# Patient Record
Sex: Female | Born: 1985 | Race: White | Hispanic: No | Marital: Single | Smoking: Never smoker
Health system: Southern US, Community
[De-identification: ages and names within clinical notes are randomized; demographics above are authoritative.]

## PROBLEM LIST (undated history)

## (undated) DIAGNOSIS — R002 Palpitations: Secondary | ICD-10-CM

## (undated) DIAGNOSIS — E041 Nontoxic single thyroid nodule: Secondary | ICD-10-CM

## (undated) HISTORY — DX: Palpitations: R00.2

## (undated) HISTORY — PX: URETHRAL DILATION: SUR417

## (undated) HISTORY — DX: Nontoxic single thyroid nodule: E04.1

---

## 2007-02-25 ENCOUNTER — Emergency Department (HOSPITAL_COMMUNITY): Admission: EM | Admit: 2007-02-25 | Discharge: 2007-02-25 | Payer: Self-pay | Admitting: Emergency Medicine

## 2010-10-20 DIAGNOSIS — E041 Nontoxic single thyroid nodule: Secondary | ICD-10-CM

## 2010-10-20 HISTORY — DX: Nontoxic single thyroid nodule: E04.1

## 2010-10-24 ENCOUNTER — Other Ambulatory Visit: Payer: Self-pay | Admitting: Occupational Therapy

## 2010-10-24 ENCOUNTER — Other Ambulatory Visit: Payer: Self-pay | Admitting: Obstetrics & Gynecology

## 2010-10-24 DIAGNOSIS — E049 Nontoxic goiter, unspecified: Secondary | ICD-10-CM

## 2010-10-27 ENCOUNTER — Other Ambulatory Visit: Payer: Self-pay

## 2010-10-30 ENCOUNTER — Ambulatory Visit
Admission: RE | Admit: 2010-10-30 | Discharge: 2010-10-30 | Disposition: A | Discharge status: Home / Self Care | Payer: BC Managed Care – PPO | Source: Ambulatory Visit | Attending: Obstetrics & Gynecology | Admitting: Obstetrics & Gynecology

## 2010-10-30 DIAGNOSIS — E049 Nontoxic goiter, unspecified: Secondary | ICD-10-CM

## 2012-09-25 IMAGING — US US SOFT TISSUE HEAD/NECK
1 series · 14 of 25 positions shown · non-contrast
Comparison: None.

CLINICAL DATA: Enlarged thyroid gland on physical examination.

THYROID ULTRASOUND
TECHNIQUE: Ultrasound examination of the thyroid gland and adjacent
soft tissues was performed.

[Series 1: us soft tissue head/neck · 0.06mm/px · 14 of 35 slices shown]
[im 1/35]
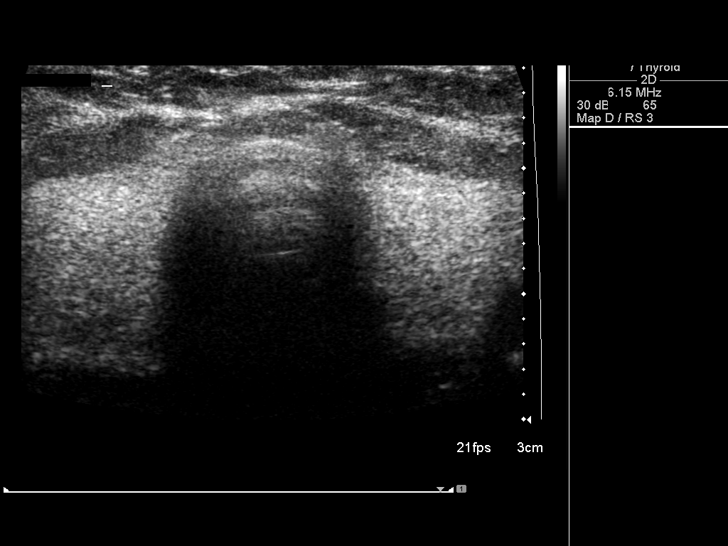
[im 3/35]
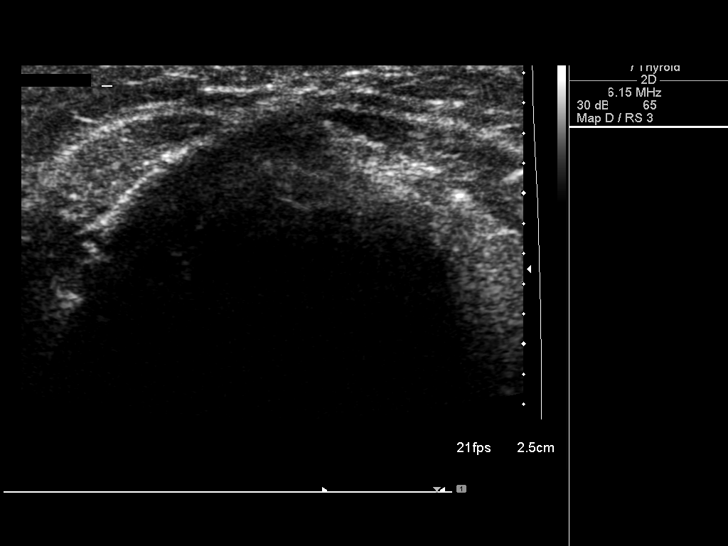
[im 6/35]
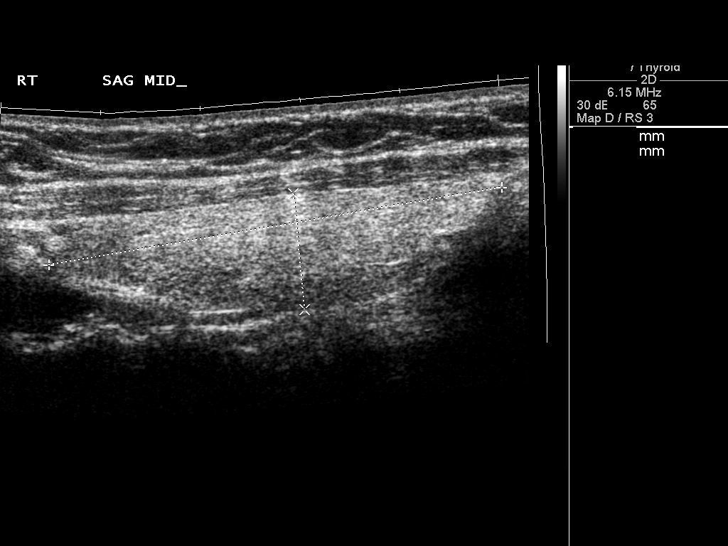
[im 9/35]
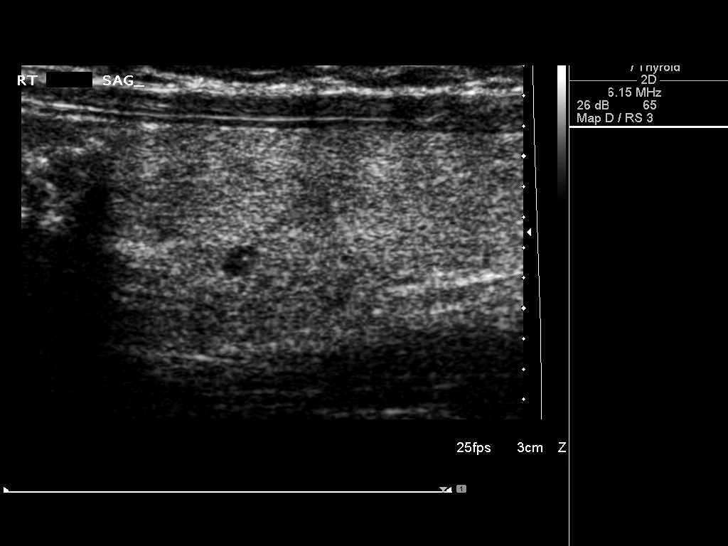
[im 12/35]
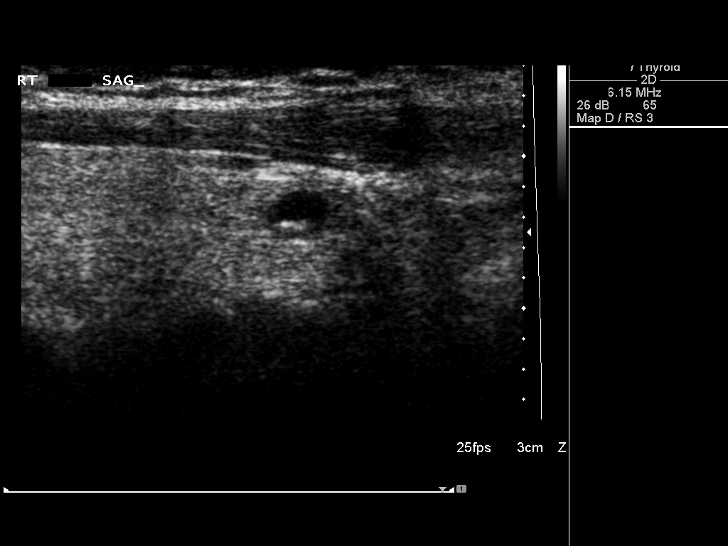
[im 13/35]
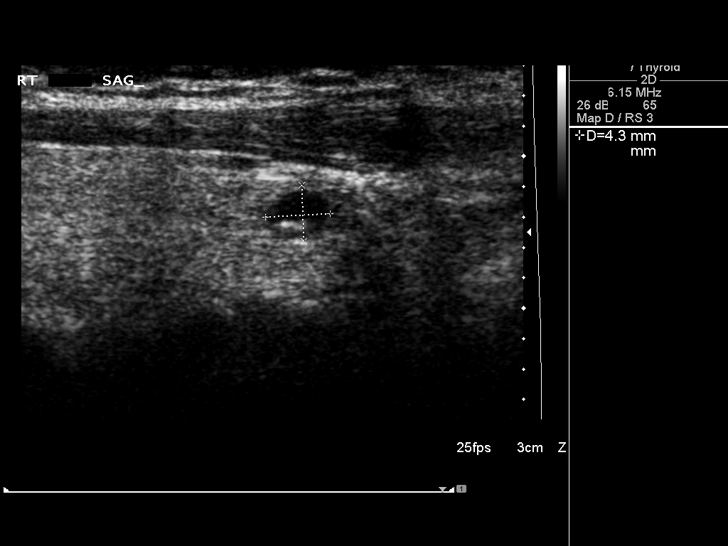
[im 16/35]
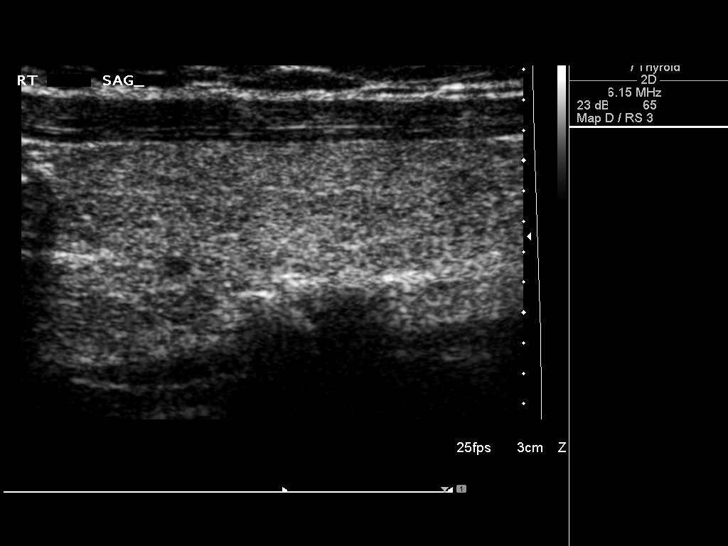
[im 19/35]
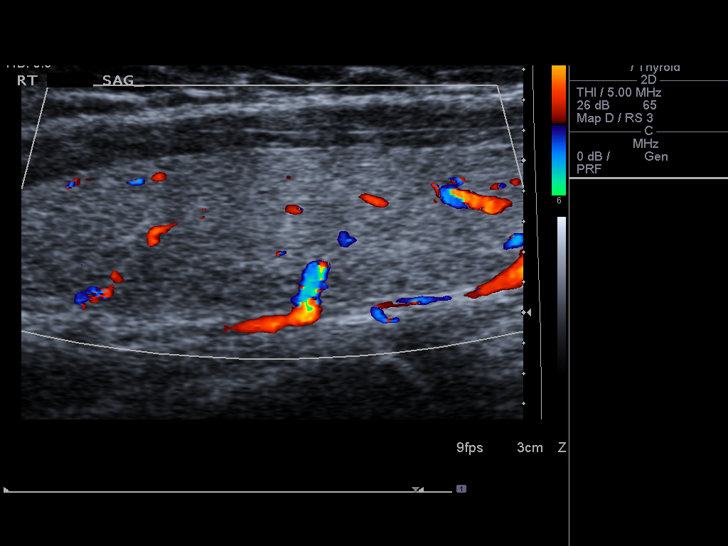
[im 22/35]
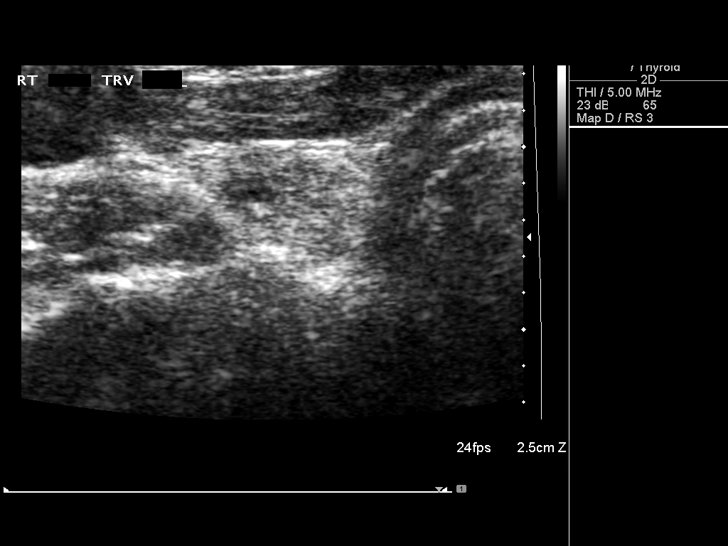
[im 23/35]
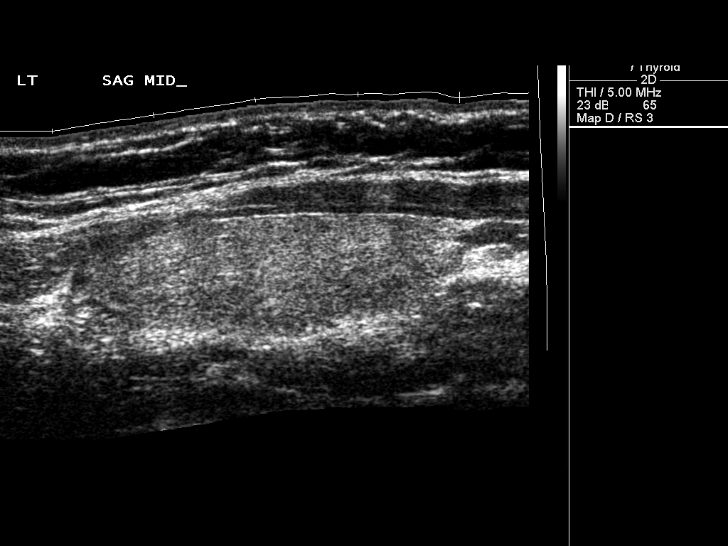
[im 26/35]
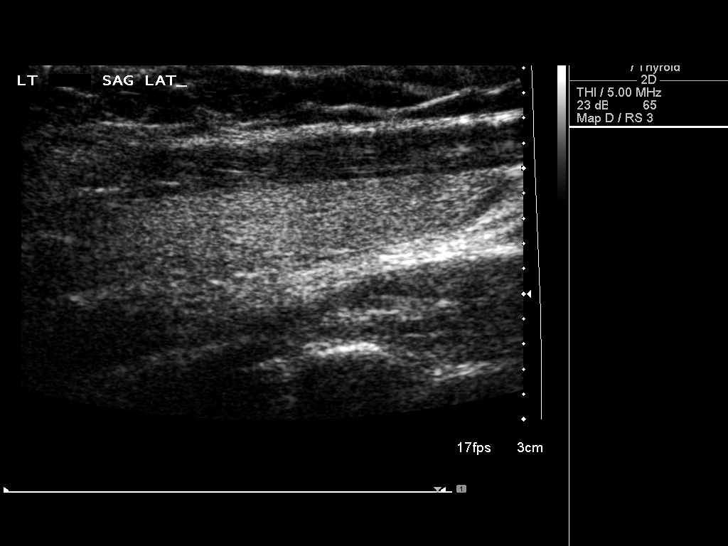
[im 29/35]
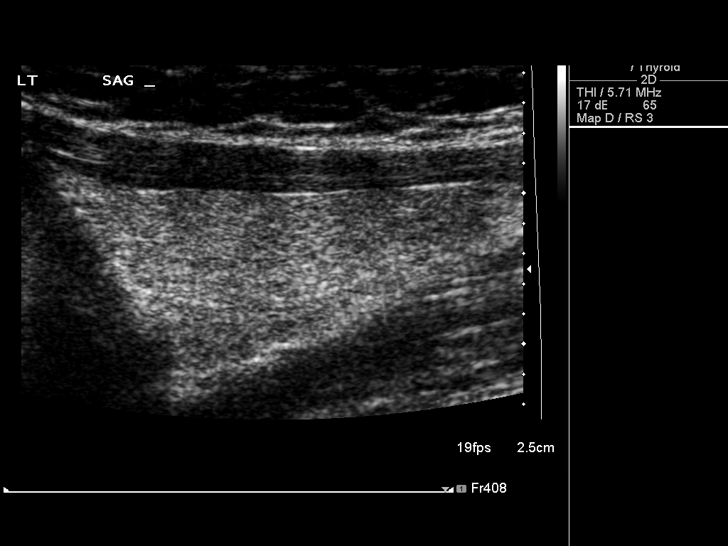
[im 32/35]
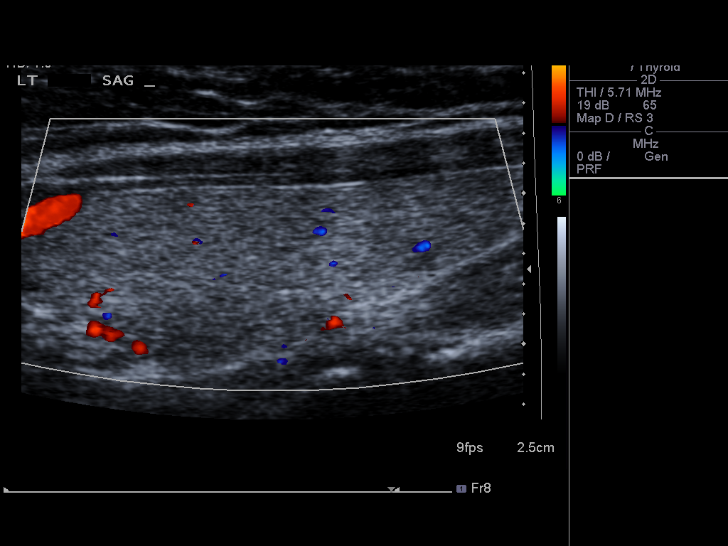
[im 35/35]
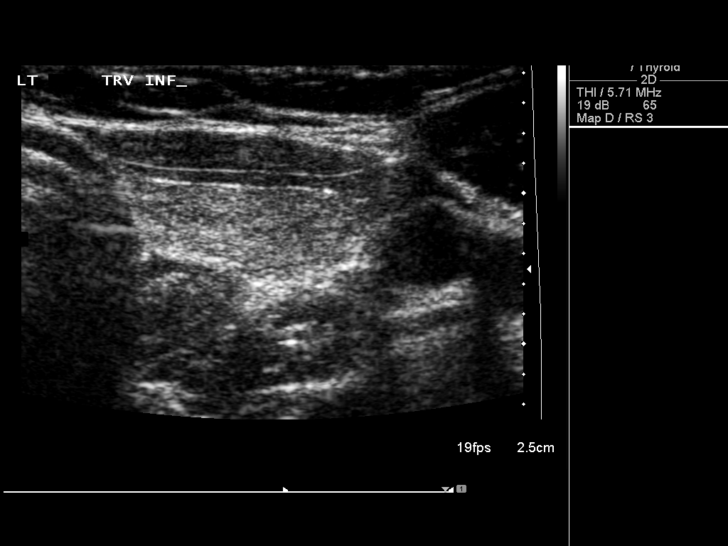

[14 of 25 positions shown; findings below may reference images not displayed]

FINDINGS: Right thyroid lobe:  4.6 x 1.2 x 1.8 cm
Left thyroid lobe:  3.9 x 1.3 x 1.6 cm
Isthmus:  0.3 cm

Focal nodules:  Thyroid echotexture is homogeneous.  Tiny cystic
nodule in the midportion of the right lobe measures 0.3 cm and has
a benign appearance.  Small predominately cystic nodule in the
inferior right lobe measures 0.4 cm and has a benign appearance as
well.

Lymphadenopathy:  None visualized.
IMPRESSION: Benign appearance of thyroid gland by ultrasound which is not
grossly enlarged by measurements.

## 2012-11-05 ENCOUNTER — Encounter: Payer: Self-pay | Admitting: Certified Nurse Midwife

## 2012-11-10 ENCOUNTER — Encounter: Payer: Self-pay | Admitting: Certified Nurse Midwife

## 2012-11-10 ENCOUNTER — Ambulatory Visit (INDEPENDENT_AMBULATORY_CARE_PROVIDER_SITE_OTHER): Payer: BC Managed Care – PPO | Admitting: Certified Nurse Midwife

## 2012-11-10 VITALS — BP 98/62 | HR 60 | Resp 16 | Ht 66.25 in | Wt 175.0 lb

## 2012-11-10 DIAGNOSIS — E049 Nontoxic goiter, unspecified: Secondary | ICD-10-CM

## 2012-11-10 DIAGNOSIS — N760 Acute vaginitis: Secondary | ICD-10-CM

## 2012-11-10 DIAGNOSIS — B373 Candidiasis of vulva and vagina: Secondary | ICD-10-CM

## 2012-11-10 DIAGNOSIS — Z0000 Encounter for general adult medical examination without abnormal findings: Secondary | ICD-10-CM

## 2012-11-10 DIAGNOSIS — E041 Nontoxic single thyroid nodule: Secondary | ICD-10-CM

## 2012-11-10 DIAGNOSIS — Z01419 Encounter for gynecological examination (general) (routine) without abnormal findings: Secondary | ICD-10-CM

## 2012-11-10 LAB — POCT URINALYSIS DIPSTICK
Bilirubin, UA: NEGATIVE
Blood, UA: NEGATIVE
Glucose, UA: NEGATIVE
Leukocytes, UA: NEGATIVE
Nitrite, UA: NEGATIVE
Urobilinogen, UA: NEGATIVE
pH, UA: 5

## 2012-11-10 MED ORDER — FLUCONAZOLE 150 MG PO TABS: 150.0000 mg | ORAL_TABLET | Freq: Once | ORAL | Status: DC

## 2012-11-10 MED ORDER — METRONIDAZOLE 0.75 % VA GEL: 1.0000 | Freq: Two times a day (BID) | VAGINAL | Status: DC

## 2012-11-10 MED ORDER — METRONIDAZOLE 0.75 % VA GEL: 1.0000 | Freq: Every day | VAGINAL | Status: DC

## 2012-11-10 MED ORDER — NORETHIN ACE-ETH ESTRAD-FE 1-20 MG-MCG PO TABS: 1.0000 | ORAL_TABLET | Freq: Every day | ORAL | Status: DC

## 2012-11-10 NOTE — Progress Notes (Signed)
27 y.o. G0P0000 Single Caucasian Fe here for annual exam. Periods normal, no issues. Contraception working well, desires continuance. No STD screening needed, no partner change. Has noticed increase in vaginal discharge over the past few weeks with slight odor.  No new personal products or thong use.  Has started exercising more and wears work out clothes longer.  No other health issues today.  Patient's last menstrual period was 10/19/2012.          Sexually active: yes  The current method of family planning is OCP (estrogen/progesterone).    Exercising: yes  aerobics & spinning Smoker:  no  Health Maintenance: Pap:  10-24-10 neg MMG:  none Colonoscopy:  none BMD:   none TDaP:  2010 Labs: Poct urine-neg, Hgb-13.4 Self breast exam: not done   reports that she has never smoked. She does not have any smokeless tobacco history on file. She reports that she does not drink alcohol or use illicit drugs.  Past Medical History  Diagnosis Date  . Thyroid nodule 6/12    benign    Past Surgical History  Procedure Laterality Date  . Urethral dilation      age 88 & 57    Current Outpatient Prescriptions  Medication Sig Dispense Refill  . norethindrone-ethinyl estradiol (LOESTRIN FE 1/20) 1-20 MG-MCG tablet Take 1 tablet by mouth daily.       No current facility-administered medications for this visit.    Family History  Problem Relation Age of Onset  . Hypertension Mother   . Thyroid disease Father   . Heart failure Father   . Breast cancer Maternal Grandmother     ROS:  Pertinent items are noted in HPI.  Otherwise, a comprehensive ROS was negative.  Exam:   BP 98/62  Pulse 60  Resp 16  Ht 5' 6.25" (1.683 m)  Wt 175 lb (79.379 kg)  BMI 28.02 kg/m2  LMP 10/19/2012 Height: 5' 6.25" (168.3 cm)  Ht Readings from Last 3 Encounters:  11/10/12 5' 6.25" (1.683 m)    General appearance: alert, cooperative and appears stated age Head: Normocephalic, without obvious abnormality,  atraumatic Neck: no adenopathy, supple, symmetrical, trachea midline and thyroid enlarged and nodules noted bilaterally Lungs: clear to auscultation bilaterally Breasts: normal appearance, no masses or tenderness, No nipple retraction or dimpling, No nipple discharge or bleeding, No axillary or supraclavicular adenopathy Heart: regular rate and rhythm Abdomen: soft, non-tender; no masses,  no organomegaly Extremities: extremities normal, atraumatic, no cyanosis or edema Skin: Skin color, texture, turgor normal. No rashes or lesions Lymph nodes: Cervical, supraclavicular, and axillary nodes normal. No abnormal inguinal nodes palpated Neurologic: Grossly normal   Pelvic: External genitalia:  no lesions              Urethra:  normal appearing urethra with no masses, tenderness or lesions              Bartholin's and Skene's: normal                 Vagina: normal appearing vagina with normal color and grey white odorous  discharge, no lesions  Wet Prep taken              Cervix: normal, non tender              Pap taken: no Bimanual Exam:  Uterus:  normal size, contour, position, consistency, mobility, non-tender and anteflexed              Adnexa: normal adnexa and  no mass, fullness, tenderness               Rectovaginal: Confirms               Anus:  normal sphincter tone, no lesions  Wet Prep: Positive yeast and BV,  Negative trich  A:  Well Woman with normal exam  Contraception: OCP  Yeast vaginitis  BV  Enlarged thyroid with nodules palpated bilaterally    P:  Reviewed health and wellness pertinent to exam   Rx: Microgestin see order  Reviewed findings or yeast and BV  Rx Diflucan see order  Rx Metrogel see order  Discussed findings and need to [place] to evaluate, agreeable  [id number] panel withTSH   Pap smear as per guidelines   pap smear not taken today  counseled on breast self exam, STD prevention, adequate intake of calcium and vitamin D, diet and exercise  return  annually or prn  An After Visit Summary was printed and given to the patient.  Reviewed, TL

## 2012-11-10 NOTE — Patient Instructions (Addendum)
General topics  Next pap or exam is  due in 1 year Take a Women's multivitamin Take 1200 mg. of calcium daily - prefer dietary If any concerns in interim to call back  Breast Self-Awareness Practicing breast self-awareness may pick up problems early, prevent significant medical complications, and possibly save your life. By practicing breast self-awareness, you can become familiar with how your breasts look and feel and if your breasts are changing. This allows you to notice changes early. It can also offer you some reassurance that your breast health is good. One way to learn what is normal for your breasts and whether your breasts are changing is to do a breast self-exam. If you find a lump or something that was not present in the past, it is best to contact your caregiver right away. Other findings that should be evaluated by your caregiver include nipple discharge, especially if it is bloody; skin changes or reddening; areas where the skin seems to be pulled in (retracted); or new lumps and bumps. Breast pain is seldom associated with cancer (malignancy), but should also be evaluated by a caregiver. BREAST SELF-EXAM The best time to examine your breasts is 5 7 days after your menstrual period is over.  ExitCare Patient Information 2013 [employer].   Exercise to Stay Healthy Exercise helps you become and stay healthy. EXERCISE IDEAS AND TIPS Choose exercises that:  You enjoy.  Fit into your day. You do not need to exercise really hard to be healthy. You can do exercises at a slow or medium level and stay healthy. You can:  Stretch before and after working out.  Try yoga, Pilates, or tai chi.  Lift weights.  Walk fast, swim, jog, run, climb stairs, bicycle, dance, or rollerskate.  Take aerobic classes. Exercises that burn about 150 calories:  Running 1  miles in 15 minutes.  Playing volleyball for 45 to 60 minutes.  Washing and waxing a car for 45 to 60  minutes.  Playing touch football for 45 minutes.  Walking 1  miles in 35 minutes.  Pushing a stroller 1  miles in 30 minutes.  Playing basketball for 30 minutes.  Raking leaves for 30 minutes.  Bicycling 5 miles in 30 minutes.  Walking 2 miles in 30 minutes.  Dancing for 30 minutes.  Shoveling snow for 15 minutes.  Swimming laps for 20 minutes.  Walking up stairs for 15 minutes.  Bicycling 4 miles in 15 minutes.  Gardening for 30 to 45 minutes.  Jumping rope for 15 minutes.  Washing windows or floors for 45 to 60 minutes. Document Released: 06/09/2010 Document Revised: 07/30/2011 Document Reviewed: 06/09/2010 ExitCare Patient Information 2013 [employer].   Other topics ( that may be useful information):    Sexually Transmitted Disease Sexually transmitted disease (STD) refers to any infection that is passed from person to person during sexual activity. This may happen by way of saliva, semen, blood, vaginal mucus, or urine. Common STDs include:  Gonorrhea.  Chlamydia.  Syphilis.  HIV/AIDS.  Genital herpes.  Hepatitis B and C.  Trichomonas.  Human papillomavirus (HPV).  Pubic lice. CAUSES  An STD may be spread by bacteria, virus, or parasite. A person can get an STD by:  Sexual intercourse with an infected person.  Sharing sex toys with an infected person.  Sharing needles with an infected person.  Having intimate contact with the genitals, mouth, or rectal areas of an infected person. SYMPTOMS  Some people may not have any symptoms, but   they can still pass the infection to others. Different STDs have different symptoms. Symptoms include:  Painful or bloody urination.  Pain in the pelvis, abdomen, vagina, anus, throat, or eyes.  Skin rash, itching, irritation, growths, or sores (lesions). These usually occur in the genital or anal area.  Abnormal vaginal discharge.  Penile discharge in men.  Soft, flesh-colored skin growths in the  genital or anal area.  Fever.  Pain or bleeding during sexual intercourse.  Swollen glands in the groin area.  Yellow skin and eyes (jaundice). This is seen with hepatitis. DIAGNOSIS  To make a diagnosis, your caregiver may:  Take a medical history.  Perform a physical exam.  Take a specimen (culture) to be examined.  Examine a sample of discharge under a microscope.  Perform blood test TREATMENT   Chlamydia, gonorrhea, trichomonas, and syphilis can be cured with antibiotic medicine.  Genital herpes, hepatitis, and HIV can be treated, but not cured, with prescribed medicines. The medicines will lessen the symptoms.  Genital warts from HPV can be treated with medicine or by freezing, burning (electrocautery), or surgery. Warts may come back.  HPV is a virus and cannot be cured with medicine or surgery.However, abnormal areas may be followed very closely by your caregiver and may be removed from the cervix, vagina, or vulva through office procedures or surgery. If your diagnosis is confirmed, your recent sexual partners need treatment. This is true even if they are symptom-free or have a negative culture or evaluation. They should not have sex until their caregiver says it is okay. HOME CARE INSTRUCTIONS  All sexual partners should be informed, tested, and treated for all STDs.  Take your antibiotics as directed. Finish them even if you start to feel better.  Only take over-the-counter or prescription medicines for pain, discomfort, or fever as directed by your caregiver.  Rest.  Eat a balanced diet and drink enough fluids to keep your urine clear or pale yellow.  Do not have sex until treatment is completed and you have followed up with your caregiver. STDs should be checked after treatment.  Keep all follow-up appointments, Pap tests, and blood tests as directed by your caregiver.  Only use latex condoms and water-soluble lubricants during sexual activity. Do not use  petroleum jelly or oils.  Avoid alcohol and illegal drugs.  Get vaccinated for HPV and hepatitis. If you have not received these vaccines in the past, talk to your caregiver about whether one or both might be right for you.  Avoid risky sex practices that can break the skin. The only way to avoid getting an STD is to avoid all sexual activity.Latex condoms and dental dams (for oral sex) will help lessen the risk of getting an STD, but will not completely eliminate the risk. SEEK MEDICAL CARE IF:   You have a fever.  You have any new or worsening symptoms. Document Released: 07/28/2002 Document Revised: 07/30/2011 Document Reviewed: 08/04/2010 ExitCare Patient Information 2013 [employer].    Domestic Abuse You are being battered or abused if someone close to you hits, pushes, or physically hurts you in any way. You also are being abused if you are forced into activities. You are being sexually abused if you are forced to have sexual contact of any kind. You are being emotionally abused if you are made to feel worthless or if you are constantly threatened. It is important to remember that help is available. No one has the right to abuse you. PREVENTION OF FURTHER   ABUSE  Learn the warning signs of danger. This varies with situations but may include: the use of alcohol, threats, isolation from friends and family, or forced sexual contact. Leave if you feel that violence is going to occur.  If you are attacked or beaten, report it to the police so the abuse is documented. You do not have to press charges. The police can protect you while you or the attackers are leaving. Get the officer's name and badge number and a copy of the report.  Find someone you can trust and tell them what is happening to you: your caregiver, a nurse, clergy member, close friend or family member. Feeling ashamed is natural, but remember that you have done nothing wrong. No one deserves abuse. Document Released:  05/04/2000 Document Revised: 07/30/2011 Document Reviewed: 07/13/2010 ExitCare Patient Information 2013 [employer].    How Much is Too Much Alcohol? Drinking too much alcohol can cause injury, accidents, and health problems. These types of problems can include:   Car crashes.  Falls.  Family fighting (domestic violence).  Drowning.  Fights.  Injuries.  Burns.  Damage to certain organs.  Having a baby with birth defects. ONE DRINK CAN BE TOO MUCH WHEN YOU ARE:  Working.  Pregnant or breastfeeding.  Taking medicines. Ask your doctor.  Driving or planning to drive. If you or someone you know has a drinking problem, get help from a doctor.  Document Released: 03/03/2009 Document Revised: 07/30/2011 Document Reviewed: 03/03/2009 ExitCare Patient Information 2013 [employer].   Smoking Hazards Smoking cigarettes is extremely bad for your health. Tobacco smoke has over 200 known poisons in it. There are over 60 chemicals in tobacco smoke that cause cancer. Some of the chemicals found in cigarette smoke include:   Cyanide.  Benzene.  Formaldehyde.  Methanol (wood alcohol).  Acetylene (fuel used in welding torches).  Ammonia. Cigarette smoke also contains the poisonous gases nitrogen oxide and carbon monoxide.  Cigarette smokers have an increased risk of many serious medical problems and Smoking causes approximately:  90% of all lung cancer deaths in men.  80% of all lung cancer deaths in women.  90% of deaths from chronic obstructive lung disease. Compared with nonsmokers, smoking increases the risk of:  Coronary heart disease by 2 to 4 times.  Stroke by 2 to 4 times.  Men developing lung cancer by 23 times.  Women developing lung cancer by 13 times.  Dying from chronic obstructive lung diseases by 12 times.  . Smoking is the most preventable cause of death and disease in our society.  WHY IS SMOKING ADDICTIVE?  Nicotine is the chemical  agent in tobacco that is capable of causing addiction or dependence.  When you smoke and inhale, nicotine is absorbed rapidly into the bloodstream through your lungs. Nicotine absorbed through the lungs is capable of creating a powerful addiction. Both inhaled and non-inhaled nicotine may be addictive.  Addiction studies of cigarettes and spit tobacco show that addiction to nicotine occurs mainly during the teen years, when young people begin using tobacco products. WHAT ARE THE BENEFITS OF QUITTING?  There are many health benefits to quitting smoking.   Likelihood of developing cancer and heart disease decreases. Health improvements are seen almost immediately.  Blood pressure, pulse rate, and breathing patterns start returning to normal soon after quitting. QUITTING SMOKING   American Lung Association - 1-800-LUNGUSA  American Cancer Society - 1-800-ACS-2345 Document Released: 06/14/2004 Document Revised: 07/30/2011 Document Reviewed: 02/16/2009 ExitCare Patient Information 2013 ExitCare[employer]   LL[employer].   Stress Management Stress is a state of physical or mental tension that often results from changes in your life or normal routine. Some common causes of stress are:  Death of a loved one.  Injuries or severe illnesses.  Getting fired or changing jobs.  Moving into a new home. Other causes may be:  Sexual problems.  Business or financial losses.  Taking on a large debt.  Regular conflict with someone at home or at work.  Constant tiredness from lack of sleep. It is not just bad things that are stressful. It may be stressful to:  Win the lottery.  Get married.  Buy a new car. The amount of stress that can be easily tolerated varies from person to person. Changes generally cause stress, regardless of the types of change. Too much stress can affect your health. It may lead to physical or emotional problems. Too little stress (boredom) may also become stressful. SUGGESTIONS TO  REDUCE STRESS:  Talk things over with your family and friends. It often is helpful to share your concerns and worries. If you feel your problem is serious, you may want to get help from a professional counselor.  Consider your problems one at a time instead of lumping them all together. Trying to take care of everything at once may seem impossible. List all the things you need to do and then start with the most important one. Set a goal to accomplish 2 or 3 things each day. If you expect to do too many in a single day you will naturally fail, causing you to feel even more stressed.  Do not use alcohol or drugs to relieve stress. Although you may feel better for a short time, they do not remove the problems that caused the stress. They can also be habit forming.  Exercise regularly - at least 3 times per week. Physical exercise can help to relieve that "uptight" feeling and will relax you.  The shortest distance between despair and hope is often a good night's sleep.  Go to bed and get up on time allowing yourself time for appointments without being rushed.  Take a short "time-out" period from any stressful situation that occurs during the day. Close your eyes and take some deep breaths. Starting with the muscles in your face, tense them, hold it for a few seconds, then relax. Repeat this with the muscles in your neck, shoulders, hand, stomach, back and legs.  Take good care of yourself. Eat a balanced diet and get plenty of rest.  Schedule time for having fun. Take a break from your daily routine to relax. HOME CARE INSTRUCTIONS   Call if you feel overwhelmed by your problems and feel you can no longer manage them on your own.  Return immediately if you feel like hurting yourself or someone else. Document Released: 10/31/2000 Document Revised: 07/30/2011 Document Reviewed: 06/23/2007 ExitCare Patient Information 2013 [employer].   

## 2012-11-11 ENCOUNTER — Telehealth: Payer: Self-pay | Admitting: Orthopedic Surgery

## 2012-11-11 NOTE — Telephone Encounter (Signed)
Spoke with pt about appt for thyroid US appt at [facility] 11-13-12 at 10:45. Pt agreeable. [address] location.

## 2012-11-12 ENCOUNTER — Telehealth: Payer: Self-pay | Admitting: Certified Nurse Midwife

## 2012-11-12 NOTE — Telephone Encounter (Signed)
Patient wants to discuss BC. The birth control she was on approximally 2 years ago. She said that when she was on it she only had her period for 4 days.?????

## 2012-11-13 ENCOUNTER — Telehealth: Payer: Self-pay | Admitting: Certified Nurse Midwife

## 2012-11-13 ENCOUNTER — Ambulatory Visit
Admission: RE | Admit: 2012-11-13 | Discharge: 2012-11-13 | Disposition: A | Discharge status: Home / Self Care | Payer: BC Managed Care – PPO | Source: Ambulatory Visit | Attending: Certified Nurse Midwife | Admitting: Certified Nurse Midwife

## 2012-11-13 ENCOUNTER — Inpatient Hospital Stay: Admission: RE | Admit: 2012-11-13 | Payer: BC Managed Care – PPO | Source: Ambulatory Visit

## 2012-11-13 ENCOUNTER — Other Ambulatory Visit: Payer: BC Managed Care – PPO

## 2012-11-13 DIAGNOSIS — E049 Nontoxic goiter, unspecified: Secondary | ICD-10-CM

## 2012-11-13 DIAGNOSIS — E041 Nontoxic single thyroid nodule: Secondary | ICD-10-CM

## 2012-11-13 NOTE — Telephone Encounter (Signed)
Spoke with pt about OCP. Pt was just here for Aex 11-10-12, but didn't mention it then as she was more concerned with her thyroid. Pt reports her cycles have been getting heavier over time, and remembers when she was on Lo loestrin in 2012 her cycles were only about 4 days and much lighter then. Advised pt she switched from that pill to loestrin 1/20 Fe due to cost and wanting a generic. Advised pt to go to ins co website and print formulary to see which pills might be cheaper. Pt interested in DL input for a pill that might lessen her cycles without being too expensive. Have pt fax formulary list?

## 2012-11-13 NOTE — Telephone Encounter (Signed)
Advised pt to go ahead and fax formulary list to [place], as ins co may not pay 100% of the more expensive pills. Gave pt our fax number. Pt agreeable.

## 2012-11-13 NOTE — Telephone Encounter (Signed)
Patient returning phone call 

## 2012-11-13 NOTE — Telephone Encounter (Signed)
Pt called back to report she had called [employer] and rep there told her that [facility] pills were all covered under her plan. Pt understood that to mean they were covered at 100%.

## 2012-11-13 NOTE — Telephone Encounter (Signed)
Have her fax formulary list, is a better thought.  Even though covered 100% is that what they will pay?

## 2013-08-17 ENCOUNTER — Telehealth: Payer: Self-pay | Admitting: Nurse Practitioner

## 2013-08-17 NOTE — Telephone Encounter (Signed)
Patient would like to talk to a nurse regarding her birth control.

## 2013-08-17 NOTE — Telephone Encounter (Signed)
Spoke with patient. She is currently on Microgestin. Last weekend, she missed one pill on Saturday 3/21 and took two pills on Sunday 3/22. Since Monday night (3/23) she has been having some vaginal spotting. She will be taking last active pill this Friday and expect cycle after. Patient is sexually active. She states that for the last 6 months, periods have been much more heavier and have become more painful.  I advised patient to take a home pregnancy test to rule out pregnancy and offered office visit for evaluation of increasing painful and heavier cycles and Ashley Guerra can further assess if still spotting. Patient is agreeable. Office visit scheduled for this Thursday, 4/2 at 1115, patient requests 4/2 appointment.   Routing to provider for final review. Patient agreeable to disposition. Will close encounter

## 2013-08-20 ENCOUNTER — Encounter: Payer: Self-pay | Admitting: Certified Nurse Midwife

## 2013-08-20 ENCOUNTER — Ambulatory Visit (INDEPENDENT_AMBULATORY_CARE_PROVIDER_SITE_OTHER): Payer: BC Managed Care – PPO | Admitting: Certified Nurse Midwife

## 2013-08-20 VITALS — BP 118/76 | HR 80 | Resp 16 | Ht 66.25 in | Wt 173.0 lb

## 2013-08-20 DIAGNOSIS — N920 Excessive and frequent menstruation with regular cycle: Secondary | ICD-10-CM

## 2013-08-20 NOTE — Progress Notes (Signed)
28 y.o. Single Caucasian female G0P0000 here with complaining off cycle slightly heavier every other month. Patient also missed her first pill in 3 years and noted some spotting, which has stopped now. No increase in cramping or duration. Patient was " told by nurse when she called about forgetting pill that the change in cycle not normal" so patient here for evaluation. Patient consistent OCP use until this incident. Patient was given different generic in the past 6 months and noticed change in cycle then. No other issues. No health issues or other medications. Sexually active, no concerns.   O: Healthy WD,WN female Affect: normal  Skin:warm and dry Thyroid: normal, no nodules palpated, no enlargement Abdomen:non tender Pelvic exam:EXTERNAL GENITALIA: normal appearing vulva with no masses, tenderness or lesions VAGINA: no abnormal discharge or lesions CERVIX: no lesions or cervical motion tenderness and non tender, normal UTERUS: normal, non tender ADNEXA: no masses palpable and nontender  A: Normal pelvic exam Contraception OCP new generic in past 6 months with slight cycle change Missed pill X1 with spotting only    P: Discussed findings of normal pelvic exam.  Discussed slight change in pills may effect cycle, but her description still with in normal range. Encouraged regular time of day for pill. Questions addressed. Keep menses calendar and reassess if needed at aex 6/15.  Rv prn, aex due 6/15   Labs   Instructions given regarding:  RV

## 2013-08-21 NOTE — Progress Notes (Signed)
Reviewed personally.  M. Suzanne Gaylynn Seiple, MD.  

## 2013-09-03 ENCOUNTER — Telehealth: Payer: Self-pay | Admitting: Certified Nurse Midwife

## 2013-09-03 NOTE — Telephone Encounter (Signed)
Patient requesting to speak with a nurse about her birth control.

## 2013-09-03 NOTE — Telephone Encounter (Signed)
Spoke with patient. Patient is currently on day 6 of birth control pack. Advised Verner Choleborah S. Leonard CNM would like patient to stop taking Loestrin Fe at this time and continue with a back up method of birth control. Would like patient to monitor heart fluttering while not taking Loestrin Fe and to see PCP. Patient states that she has an appointment with PCP scheduled tomorrow at 0900. Advised patient to send over records from tomorrow's office visit with PCP for our records and evaluation by Verner Choleborah S. Leonard CNM. Fax number provided to patient.Once she has been seen by PCP and records have been sent over would like patient to come in for office visit with Verner Choleborah S. Leonard CNM to discuss findings and new birth control options. Patient is agreeable and verbalizes understanding.  Routing to provider for final review. Patient agreeable to disposition. Will close encounter.

## 2013-09-03 NOTE — Telephone Encounter (Signed)
Spoke with patient. Patient states that she has been taking Loestrin Fe 1/20 and has had episodes of her heart "fluttering." Patient states "It happens every once in a blue moon and has happened around three times now. It happened today but seem to last longer than usual." Patient spoke with friend who is a Designer, jewelleryregistered nurse and told her it could be due to her birth control. Patient would like to know if she should switch oral birth control and how to proceed. Patient has PCP that she sees regularly. Advised to schedule appointment with PCP as soon as possible for evaluation. Advised if symptoms worsen or patient is short of breath needs to see urgent care. Patient agreeable stating that she will call and schedule appointment with PCP. Advised would speak with provider about birth control instructions and call her back. Patient is agreeable.

## 2013-09-04 ENCOUNTER — Telehealth: Payer: Self-pay | Admitting: Certified Nurse Midwife

## 2013-09-04 NOTE — Telephone Encounter (Signed)
Agree with disposition encounter closed

## 2013-09-04 NOTE — Telephone Encounter (Addendum)
Pt is calling to talk with the nurse she wants to give an update on her recent doctor's visit.

## 2013-09-04 NOTE — Telephone Encounter (Signed)
Spoke with patient. Patient calling to give update on appointment with PCP that she had today. Patient states that blood work was done, EKG was done that showed everything is normal. Was referred to Cardiologist in [place] and has appointment for Aug 24th. Patient would like to know if Verner Choleborah S. Leonard CNM knows of a cardiologist that she could see in [place]. Advised would check with Verner Choleborah S. Leonard CNM about local cardiologist but PCP would have to place referral to see someone closer. Patient states she is waiting to hear about blood work results. States she will have PCP send over records from office visit as well as cardiologist when she has appointment. Patient advised to call office after cardiologist appointment and schedule office visit with Verner Choleborah S. Leonard CNM to discuss results and future birth control options. Patient agreeable and verbalizes understanding.

## 2013-09-08 ENCOUNTER — Encounter: Payer: Self-pay | Admitting: *Deleted

## 2013-09-11 ENCOUNTER — Encounter: Payer: Self-pay | Admitting: Cardiovascular Disease

## 2013-09-11 ENCOUNTER — Ambulatory Visit (INDEPENDENT_AMBULATORY_CARE_PROVIDER_SITE_OTHER): Payer: BC Managed Care – PPO | Admitting: Cardiovascular Disease

## 2013-09-11 VITALS — BP 112/75 | HR 75 | Ht 66.0 in | Wt 174.0 lb

## 2013-09-11 DIAGNOSIS — R002 Palpitations: Secondary | ICD-10-CM

## 2013-09-11 DIAGNOSIS — I4892 Unspecified atrial flutter: Secondary | ICD-10-CM

## 2013-09-11 DIAGNOSIS — R000 Tachycardia, unspecified: Secondary | ICD-10-CM

## 2013-09-11 NOTE — Patient Instructions (Signed)
Follow up as needed

## 2013-09-13 DIAGNOSIS — R002 Palpitations: Secondary | ICD-10-CM | POA: Insufficient documentation

## 2013-09-13 NOTE — Assessment & Plan Note (Signed)
The patient's symptoms are suggestive of premature beats. These overall are not happening frequently and has improved without intervention. Physical exam and baseline EKG are normal. Symptoms are now not happening frequently enough to be captured on a Holter monitor. Thus, the diagnostic utility of this is very low. Physical exam is not suggestive of any structural heart disease. Thus, no further workup is recommended at this time. I think the only thing we need to clarify is her father's condition leading to ICD placement to make sure it was not due to hypertrophic cardiomyopathy or some other inherited disorders. They are going to get me more information about this .  I asked her to followup as needed if symptoms worsen.

## 2013-09-13 NOTE — Progress Notes (Signed)
Primary care physician: Hortencia PilarDaphne Cates  HPI  This is a pleasant 28 year old female who was referred for evaluation of palpitations. She is accompanied by her mother who is one of my patients. She reports recent symptoms of palpitations described as skipping lasting for a few seconds. This has not been associated with any other symptoms. She denies any chest pain, dyspnea, dizziness, syncope or presyncope. She was on birth control pills. Palpitations improved after she stopped taking them. She consumes moderate amount of caffeine and does not take any medications on a regular basis. She had previous history of anxiety but no recent worsening. She had routine labs performed recently including thyroid function. The labs were reviewed and overall were unremarkable. She exercises in a regular basis with no limitations. Family history is negative for sudden cardiac death. She reports that her father had an ICD placement at the age of 27 due to what seems to be nonischemic cardiomyopathy. The details are not available though.   No Known Allergies   No current outpatient prescriptions on file prior to visit.   No current facility-administered medications on file prior to visit.     Past Medical History  Diagnosis Date  . Palpitations   . Thyroid nodule 6/12    benign     Past Surgical History  Procedure Laterality Date  . Urethral dilation      age 80 & [age]     Family History  Problem Relation Age of Onset  . Hypertension Mother   . Heart failure Father   . Arrhythmia Father   . Breast cancer Maternal Grandmother   . Thyroid disease Sister      History   Social History  . Marital Status: Single    Spouse Name: N/A    Number of Children: N/A  . Years of Education: N/A   Occupational History  . Not on file.   Social History Main Topics  . Smoking status: Never Smoker   . Smokeless tobacco: Never Used  . Alcohol Use: No  . Drug Use: No  . Sexual Activity: Yes    Partners:  Male    Birth Control/ Protection: Pill   Other Topics Concern  . Not on file   Social History Narrative  . No narrative on file     ROS A 10 point review of system was performed. It is negative other than that mentioned in the history of present illness.   PHYSICAL EXAM   BP 112/75  Pulse 75  Ht 5\' 6"  (1.676 m)  Wt 174 lb (78.926 kg)  BMI 28.10 kg/m2  LMP 07/31/2013 Constitutional: She is oriented to person, place, and time. She appears well-developed and well-nourished. No distress.  HENT: No nasal discharge.  Head: Normocephalic and atraumatic.  Eyes: Pupils are equal and round. No discharge.  Neck: Normal range of motion. Neck supple. No JVD present. No thyromegaly present.  Cardiovascular: Normal rate, regular rhythm, normal heart sounds. Exam reveals no gallop and no friction rub. No murmur heard.  Pulmonary/Chest: Effort normal and breath sounds normal. No stridor. No respiratory distress. She has no wheezes. She has no rales. She exhibits no tenderness.  Abdominal: Soft. Bowel sounds are normal. She exhibits no distension. There is no tenderness. There is no rebound and no guarding.  Musculoskeletal: Normal range of motion. She exhibits no edema and no tenderness.  Neurological: She is alert and oriented to person, place, and time. Coordination normal.  Skin: Skin is warm and dry. No rash  noted. She is not diaphoretic. No erythema. No pallor.  Psychiatric: She has a normal mood and affect. Her behavior is normal. Judgment and thought content normal.     EKG: Normal sinus rhythm with no significant ST or T wave changes.   ASSESSMENT AND PLAN

## 2013-11-17 ENCOUNTER — Ambulatory Visit: Payer: BC Managed Care – PPO | Admitting: Certified Nurse Midwife

## 2013-12-15 ENCOUNTER — Ambulatory Visit (INDEPENDENT_AMBULATORY_CARE_PROVIDER_SITE_OTHER): Payer: BC Managed Care – PPO | Admitting: Certified Nurse Midwife

## 2013-12-15 ENCOUNTER — Encounter: Payer: Self-pay | Admitting: Certified Nurse Midwife

## 2013-12-15 VITALS — BP 100/60 | HR 68 | Resp 18 | Ht 66.0 in | Wt 174.0 lb

## 2013-12-15 DIAGNOSIS — Z124 Encounter for screening for malignant neoplasm of cervix: Secondary | ICD-10-CM

## 2013-12-15 DIAGNOSIS — Z01419 Encounter for gynecological examination (general) (routine) without abnormal findings: Secondary | ICD-10-CM

## 2013-12-15 DIAGNOSIS — Z0000 Encounter for general adult medical examination without abnormal findings: Secondary | ICD-10-CM

## 2013-12-15 LAB — POCT URINALYSIS DIPSTICK
BILIRUBIN UA: NEGATIVE
Glucose, UA: NEGATIVE
KETONES UA: NEGATIVE
Nitrite, UA: NEGATIVE
PH UA: 5
PROTEIN UA: NEGATIVE
Urobilinogen, UA: NEGATIVE

## 2013-12-15 NOTE — Patient Instructions (Signed)

## 2013-12-15 NOTE — Progress Notes (Signed)
28 y.o. G0P0000 Single Caucasian Fe here for annual exam. Periods normal, with no spotting. Saw Cardiology for palpitations and work up negative. Stopped OCP 09/04/13. Feels much better since she stopped. Using consistent condoms now with out problems. No partner change or need for STD screening. No health concerns today.   Patient's last menstrual period was 12/08/2013.          Sexually active: Yes.    The current method of family planning is condoms most of the time.    Exercising: Yes.    cyclin, aerobics  3-4 x weekly Smoker:  no  Health Maintenance: Pap: 10/2010 Neg TDaP:  2010 Labs: PCP    reports that she has never smoked. She has never used smokeless tobacco. She reports that she does not drink alcohol or use illicit drugs.  Past Medical History  Diagnosis Date  . Palpitations   . Thyroid nodule 6/12    benign    Past Surgical History  Procedure Laterality Date  . Urethral dilation      age 73 & 26    Current Outpatient Prescriptions  Medication Sig Dispense Refill  . Multiple Vitamins-Minerals (MULTIVITAMIN PO) Take by mouth.       No current facility-administered medications for this visit.    Family History  Problem Relation Age of Onset  . Hypertension Mother   . Heart failure Father   . Arrhythmia Father   . Breast cancer Maternal Grandmother   . Thyroid disease Sister     ROS:  Pertinent items are noted in HPI.  Otherwise, a comprehensive ROS was negative.  Exam:   BP 100/60  Pulse 68  Resp 18  Ht 5\' 6"  (1.676 m)  Wt 174 lb (78.926 kg)  BMI 28.10 kg/m2  LMP 12/08/2013 Height: 5\' 6"  (167.6 cm)  Ht Readings from Last 3 Encounters:  12/15/13 5\' 6"  (1.676 m)  09/11/13 5\' 6"  (1.676 m)  08/20/13 5' 6.25" (1.683 m)    General appearance: alert, cooperative and appears stated age Head: Normocephalic, without obvious abnormality, atraumatic Neck: no adenopathy, supple, symmetrical, trachea midline and thyroid known enlargement, no size change from  previous exam. Level checked with PCP normal Lungs: clear to auscultation bilaterally Breasts: normal appearance, no masses or tenderness, No nipple retraction or dimpling, No nipple discharge or bleeding, No axillary or supraclavicular adenopathy Heart: regular rate and rhythm Abdomen: soft, non-tender; no masses,  no organomegaly Extremities: extremities normal, atraumatic, no cyanosis or edema Skin: Skin color, texture, turgor normal. No rashes or lesions Lymph nodes: Cervical, supraclavicular, and axillary nodes normal. No abnormal inguinal nodes palpated Neurologic: Grossly normal   Pelvic: External genitalia:  no lesions              Urethra:  normal appearing urethra with no masses, tenderness or lesions              Bartholin's and Skene's: normal                 Vagina: normal appearing vagina with normal color and discharge, no lesions              Cervix: normal, non tender              Pap taken: Yes.   Bimanual Exam:  Uterus:  normal size, contour, position, consistency, mobility, non-tender and anteverted              Adnexa: normal adnexa and no mass, fullness, tenderness  Rectovaginal: Confirms               Anus:  normal appearance  A:  Well Woman with normal exam  Contraception consistent condoms  Known enlarged thyroid  No size change from previous exam. PCP checked level, normal per patient  Palpitations with cardiology work up negative  P:   Reviewed health and wellness pertinent to exam  Notify if notices changes in size  Continue follow up as indicated  Pap smear taken today with reflex   counseled on breast self exam, STD prevention, HIV risk factors and prevention, adequate intake of calcium and vitamin D, diet and exercise  return annually or prn  An After Visit Summary was printed and given to the patient.

## 2013-12-16 LAB — IPS PAP TEST WITH REFLEX TO HPV

## 2013-12-16 NOTE — Progress Notes (Signed)
Reviewed personally.  M. Suzanne Emerie Vanderkolk, MD.  

## 2014-02-08 ENCOUNTER — Ambulatory Visit (INDEPENDENT_AMBULATORY_CARE_PROVIDER_SITE_OTHER): Payer: BC Managed Care – PPO | Admitting: Certified Nurse Midwife

## 2014-02-08 ENCOUNTER — Encounter: Payer: Self-pay | Admitting: Certified Nurse Midwife

## 2014-02-08 VITALS — BP 100/68 | HR 70 | Resp 16 | Ht 66.0 in | Wt 173.0 lb

## 2014-02-08 DIAGNOSIS — N760 Acute vaginitis: Secondary | ICD-10-CM

## 2014-02-08 MED ORDER — METRONIDAZOLE 0.75 % VA GEL: VAGINAL | Status: DC

## 2014-02-08 NOTE — Progress Notes (Signed)
Reviewed personally.  M. Suzanne Shealee Yordy, MD.  

## 2014-02-08 NOTE — Progress Notes (Signed)
28 yo white female  g0p0 here with complaint of vaginal symptoms of itching, burning, and increase discharge with odor. Describes discharge as watery and some white color at times. .Onset of symptoms 30 days ago. Patient has been treating herself with Monistat OTC every 2 weeks for 2 months for these symptoms, with some relief. Has started menses today, but light. Using Personal wipes all the time for odor control. Patient concerned she has chronic yeast. No STD concerns. Urinary symptoms none . Contraception: Condoms  O:Healthy female WDWN Affect: normal, orientation x 3  Exam: Abdomen: Soft, non tender Lymph node: no enlargement or tenderness Pelvic exam: External genital: no redness, lesions, or exudate BUS: negative Vagina: copious grey pink watery odorous discharge noted. Ph:5.5   ,Wet prep taken Cervix: normal, non tender, scant blood present Uterus: normal, non tender Adnexa:normal, non tender, no masses or fullness noted   Wet Prep results: positive clue cells   A:BV Chronic use of Monistat   P:Discussed findings of BV and etiology. Discussed Aveeno or baking soda sitz bath for comfort. Avoid moist clothes or pads for extended period of time. If working out in gym clothes  change underwear. Stop personal wipe use and pat area dry instead of rubbing which destroys normal flora. Rx: Metrogel see order start once period has stopped.  Rv 2 weeks once treatment completed for re check

## 2014-03-03 ENCOUNTER — Encounter: Payer: Self-pay | Admitting: Certified Nurse Midwife

## 2014-03-03 ENCOUNTER — Ambulatory Visit (INDEPENDENT_AMBULATORY_CARE_PROVIDER_SITE_OTHER): Payer: BC Managed Care – PPO | Admitting: Certified Nurse Midwife

## 2014-03-03 VITALS — BP 110/70 | HR 68 | Resp 16 | Ht 66.0 in | Wt 172.0 lb

## 2014-03-03 DIAGNOSIS — B9689 Other specified bacterial agents as the cause of diseases classified elsewhere: Secondary | ICD-10-CM

## 2014-03-03 DIAGNOSIS — N760 Acute vaginitis: Secondary | ICD-10-CM

## 2014-03-03 DIAGNOSIS — A499 Bacterial infection, unspecified: Secondary | ICD-10-CM

## 2014-03-03 NOTE — Progress Notes (Signed)
28 y.o. Single Caucasian female G0P0000 here for follow up of BV treated with Metorgel initiated on 02/08/14. Completed all medication as directed.  Denies any symptoms of itching,burning or increase discharge. External area feels normal now.  "Feels normal now".    O: Healthy WD,WN female Affect: normal Pelvic exam:EXTERNAL GENITALIA: normal appearing vulva with no masses, tenderness or lesions VAGINA: no abnormal discharge or lesions and Wet Prep/KOH no pathogens CERVIX: no lesions or cervical motion tenderness and normal UTERUS: normal, non tender ADNEXA: no masses palpable and nontender  A:BV Resolved     P: Discussed findings of normal pelvic exam and no evidence of BV or yeast now.  Cautioned against over use of products OTC. Questions addressed.   RV prn

## 2014-03-12 NOTE — Progress Notes (Signed)
Reviewed personally.  M. Suzanne Jermain Curt, MD.  

## 2014-06-14 ENCOUNTER — Telehealth: Payer: Self-pay | Admitting: Certified Nurse Midwife

## 2014-06-14 NOTE — Telephone Encounter (Signed)
Patient is having urinary urgency and is asking for an appointment. Patient declined appointment today. Patient is asking to be seen next week 06/23/14 when she is in town. I told patient the triage nurse would need to call her back. Last seen 03/03/14.

## 2014-06-14 NOTE — Telephone Encounter (Signed)
Spoke with patient. Patient states that she has been having increased urinary frequency at night. "It is not every night but have been happening more often." Denies burning with urination, back pain, fever, and chills. States she is about to start menses and has been having light pelvic cramping. Advised patient will need to be seen for evaluation. Patient is out of town and declines appointment until 2/316. Advised it is really recommended that she been seen before next week for evaluation of symptoms. Patient declines.Appointment scheduled for 2/3 at 12:45pm with Verner Choleborah S. Leonard CNM. Agreeable to date and time. Advised patient will need to be seen sooner at urgent care in the local area if symptoms increase or new symptoms develop. Advised this is very important so that she may receive treatment if is related to a UTI. Patient is agreeable.  Routing to provider for final review. Patient agreeable to disposition. Will close encounter

## 2014-06-17 ENCOUNTER — Telehealth: Payer: Self-pay | Admitting: Certified Nurse Midwife

## 2014-06-17 NOTE — Telephone Encounter (Signed)
Patient called and cancelled her appointment for 06/23/14 for "urinary frequency/cramping" due to being out of town. She says she will call back to reschedule when she returns.

## 2014-06-17 NOTE — Telephone Encounter (Signed)
Routing to Verner Choleborah S. Leonard CNM as Lorain ChildesFYI. Please see telephone call from 1/25. Patient was provided with precautions to see urgent care if symptoms worsen before she is able to be seen.

## 2014-06-23 ENCOUNTER — Ambulatory Visit: Payer: BC Managed Care – PPO | Admitting: Certified Nurse Midwife

## 2014-10-10 IMAGING — US US SOFT TISSUE HEAD/NECK
1 series · 14 of 25 positions shown · non-contrast
Comparison: 10/30/2010

CLINICAL DATA: Thyroid goiter and nodules.

THYROID ULTRASOUND
TECHNIQUE: Ultrasound examination of the thyroid gland and adjacent
soft tissues was performed.

[Series 1: us soft tissue head/neck · 0.08mm/px · 14 of 43 slices shown]
[im 1/43]
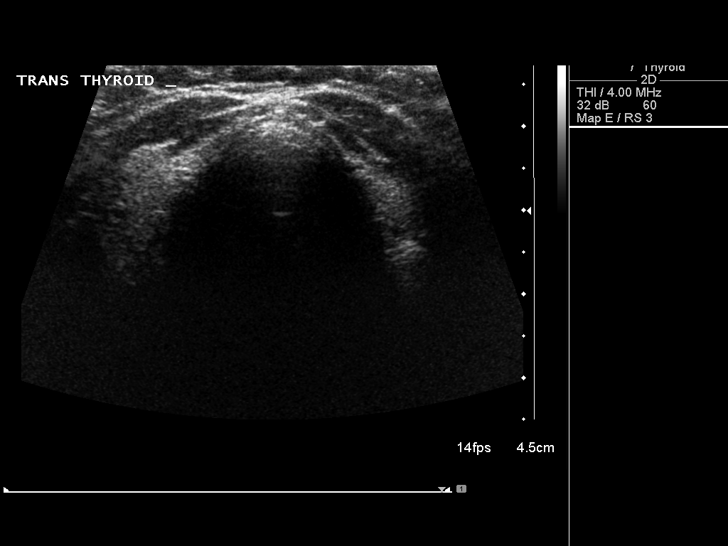
[im 4/43]
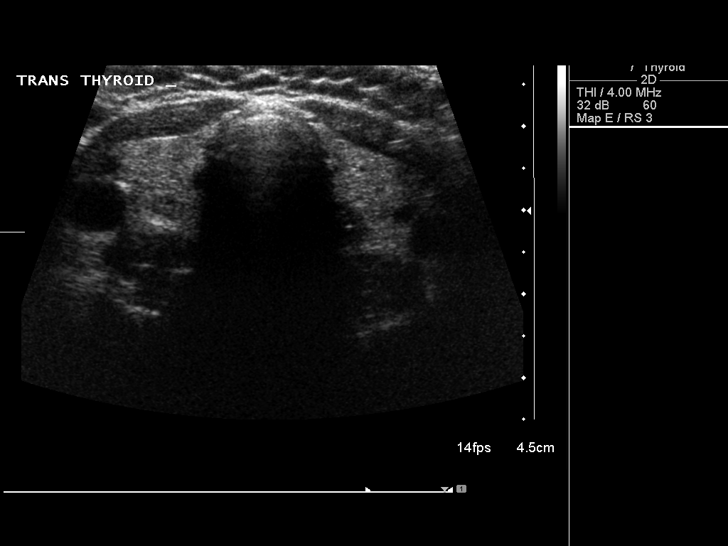
[im 8/43]
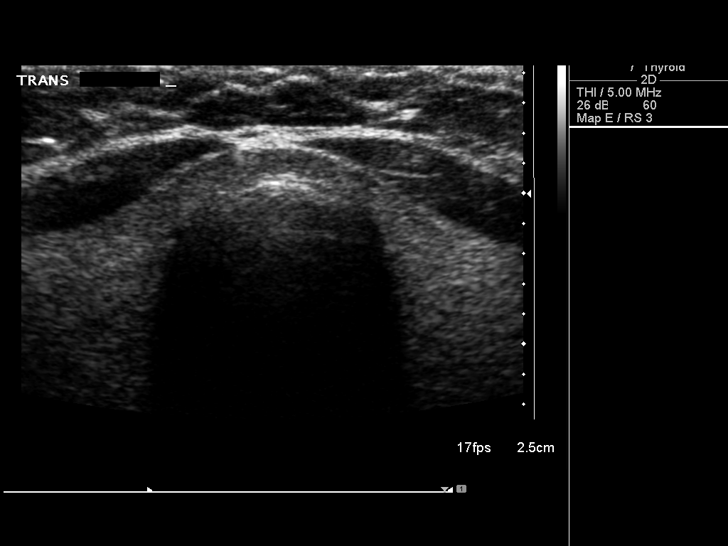
[im 11/43]
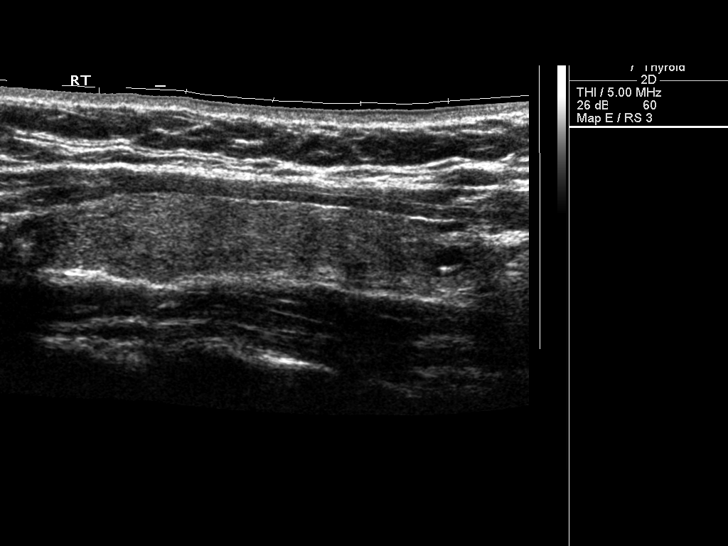
[im 15/43]
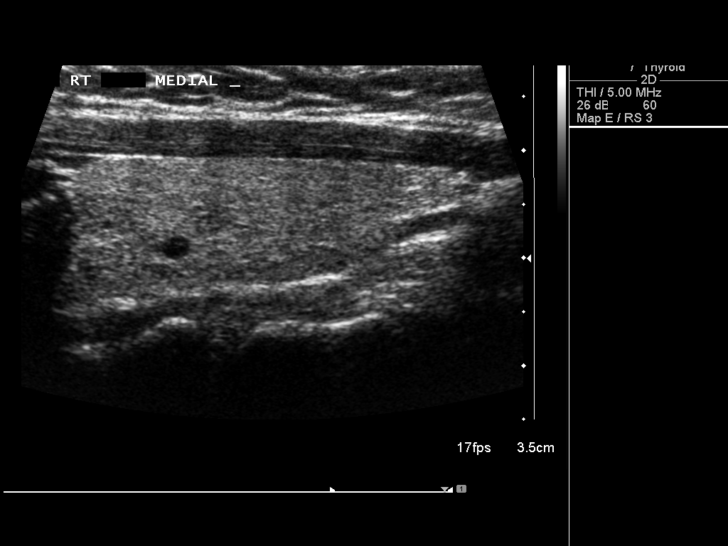
[im 16/43]
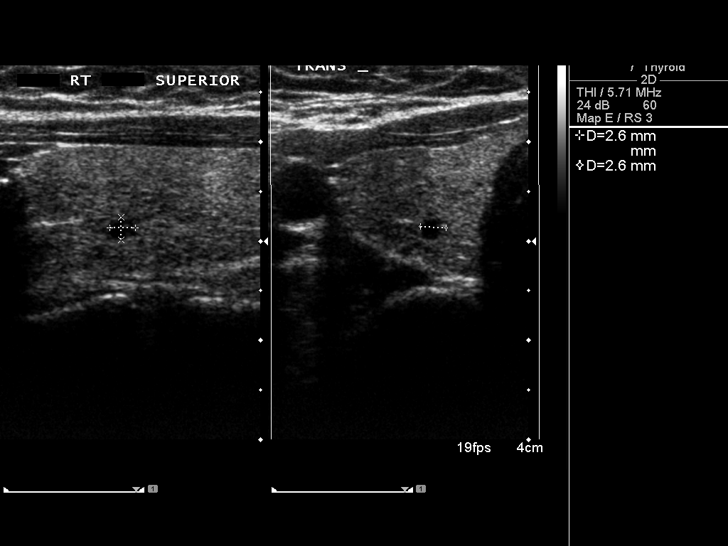
[im 20/43]
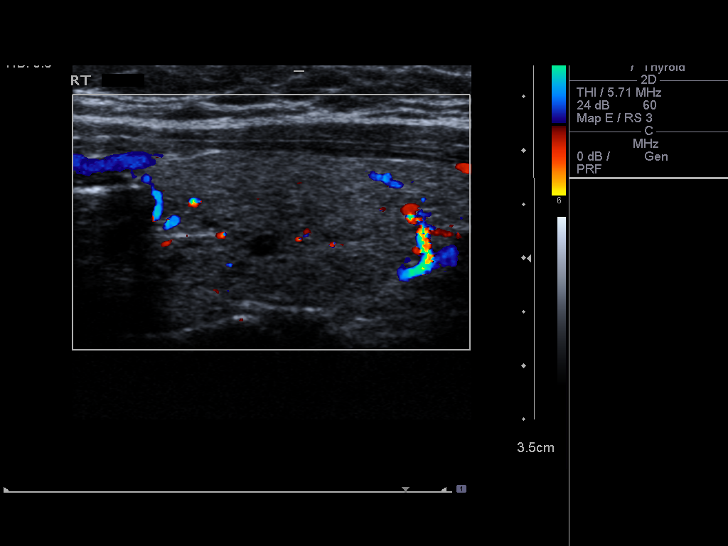
[im 23/43]
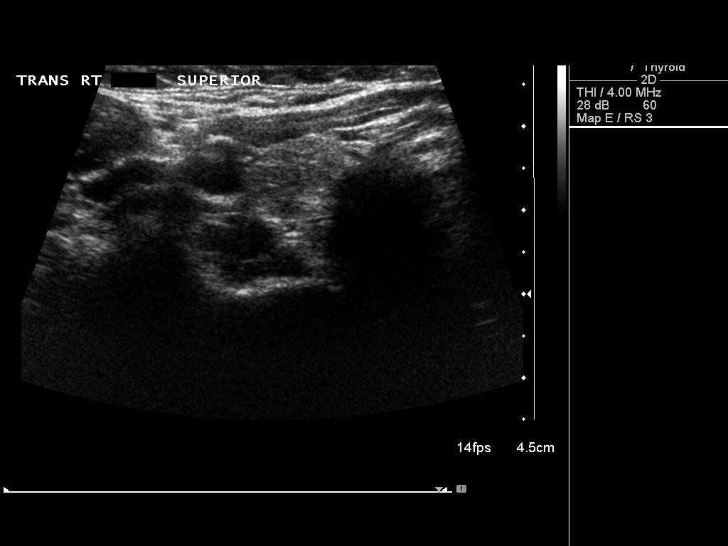
[im 27/43]
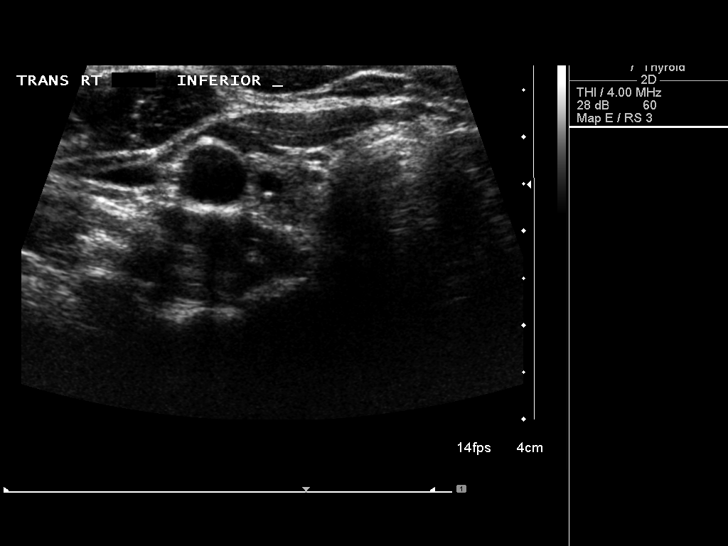
[im 29/43]
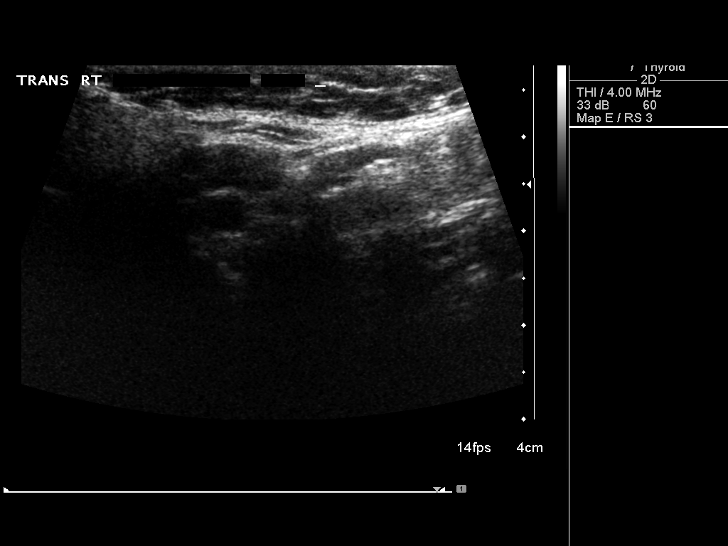
[im 32/43]
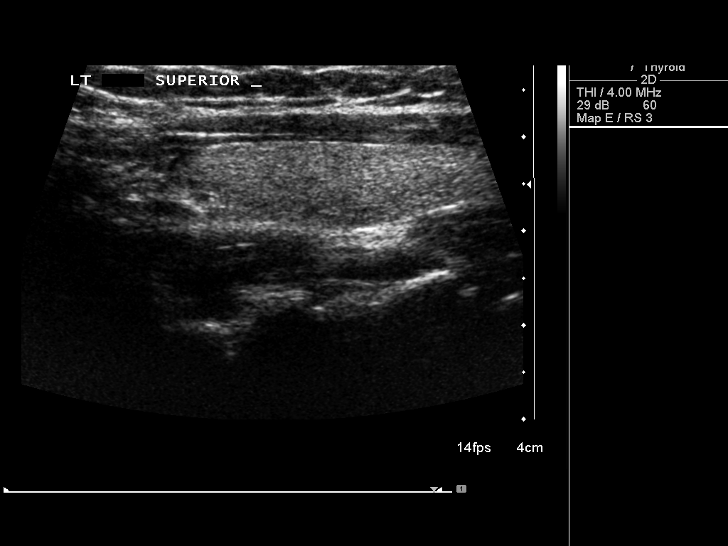
[im 36/43]
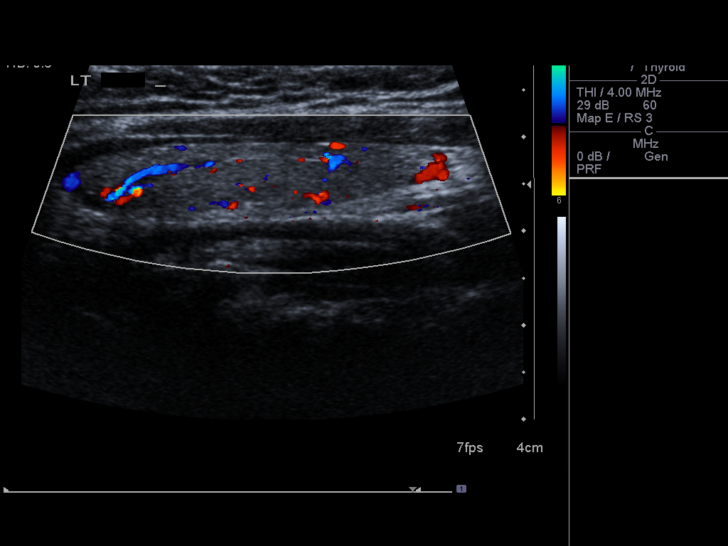
[im 39/43]
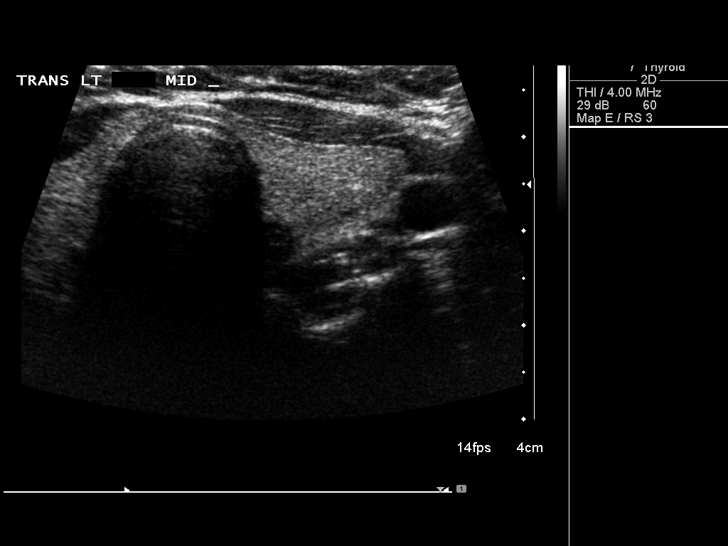
[im 43/43]
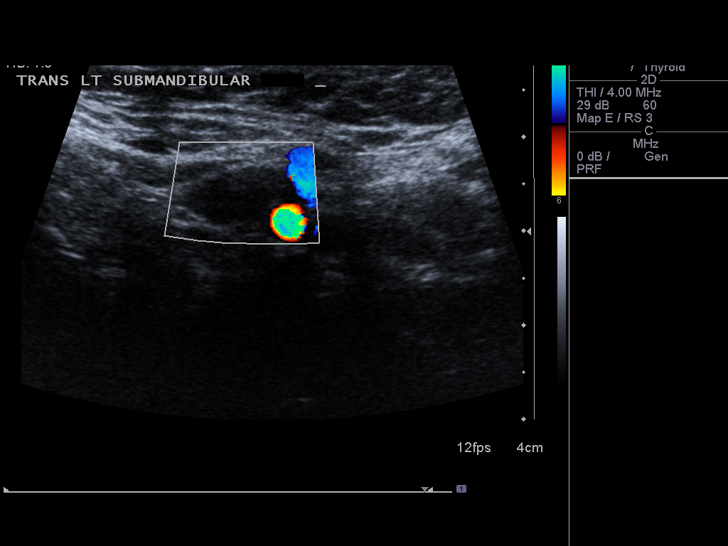

[14 of 25 positions shown; findings below may reference images not displayed]

FINDINGS: Right thyroid lobe:  Measures 5.5 x 1.1 x 1.6 cm.
Left thyroid lobe:  Measures 4.9 x 0.8 x 1.6 cm.
Isthmus:  Measures 0.18 cm.

Focal nodules:  Stable tiny right lobe thyroid nodules measuring
less than 6 mm.  No worrisome lesions.

Lymphadenopathy:  None visualized.
IMPRESSION: Mild thyroid gland enlargement and small stable right thyroid
nodules.

## 2014-12-17 ENCOUNTER — Telehealth: Payer: Self-pay | Admitting: Certified Nurse Midwife

## 2014-12-17 NOTE — Telephone Encounter (Signed)
Patient canceled her upcoming appointment via automated reminder call. I left patient a message to call and reschedule.

## 2014-12-21 ENCOUNTER — Ambulatory Visit: Payer: BC Managed Care – PPO | Admitting: Certified Nurse Midwife

## 2015-01-27 ENCOUNTER — Ambulatory Visit: Payer: BC Managed Care – PPO | Admitting: Obstetrics and Gynecology

## 2015-02-07 ENCOUNTER — Ambulatory Visit (INDEPENDENT_AMBULATORY_CARE_PROVIDER_SITE_OTHER): Payer: BC Managed Care – PPO | Admitting: Obstetrics and Gynecology

## 2015-02-07 ENCOUNTER — Encounter: Payer: Self-pay | Admitting: Obstetrics and Gynecology

## 2015-02-07 ENCOUNTER — Telehealth: Payer: Self-pay | Admitting: Certified Nurse Midwife

## 2015-02-07 VITALS — BP 100/66 | HR 64 | Resp 14 | Wt 176.0 lb

## 2015-02-07 DIAGNOSIS — N644 Mastodynia: Secondary | ICD-10-CM | POA: Diagnosis not present

## 2015-02-07 NOTE — Telephone Encounter (Signed)
Patient has swollen lymph nodes under her right armpit.

## 2015-02-07 NOTE — Telephone Encounter (Signed)
Return call to patient, left message to call back and ask for triage nurse.  Patient was scheduled for annual  exam on 12/21/14  And 01/27/15 and canceled.

## 2015-02-07 NOTE — Telephone Encounter (Signed)
Patient called right back,Complains of swollen lymph nodes under both arms near breast. Denies fever. States both sides uncomfortable but slightly worse on right. No new products, current deodorant is product she has used before without problem. States she thinks she had annual exam in May with another provider when our office was full (she had problem, possible UTI). Will see patient for problem today and will need to get record from office visit in May.  Office visit today at 1:45 with Dr Oscar La.  Routing to provider for final review. Patient agreeable to disposition. Will close encounter.

## 2015-02-07 NOTE — Progress Notes (Signed)
Patient ID: Ashley Guerra, female   DOB: 05-Dec-1985, 29 y.o.   MRN: [id number] GYNECOLOGY  VISIT   HPI: 29 y.o.   Single  Caucasian  female   G0P0000 with Patient's last menstrual period was 02/04/2015.   Here c/o swelling and tenderness under right axilla, some on the left, less prominent. Symptoms started 2-3 weeks ago, intermittent. Just started cycle on 9/16, tenderness seems worse. She does drink sweet tea, not every day. Not on contraception, sexually active, using condoms.  GYNECOLOGIC HISTORY: Patient's last menstrual period was 02/04/2015. Contraception:None Menopausal hormone therapy: N/A        OB History    Gravida Para Term Preterm AB TAB SAB Ectopic Multiple Living           Patient Active Problem List   Diagnosis Date Noted  . Palpitations 09/13/2013    Past Medical History  Diagnosis Date  . Palpitations   . Thyroid nodule 6/12    benign    Past Surgical History  Procedure Laterality Date  . Urethral dilation      age 29 & 16    No current outpatient prescriptions on file.   No current facility-administered medications for this visit.     ALLERGIES: Review of patient's allergies indicates no known allergies.  Family History  Problem Relation Age of Onset  . Hypertension Mother   . Heart failure Father   . Arrhythmia Father   . Breast cancer Maternal Grandmother   . Thyroid disease Sister     Social History   Social History  . Marital Status: Single    Spouse Name: N/A  . Number of Children: N/A  . Years of Education: N/A   Occupational History  . Not on file.   Social History Main Topics  . Smoking status: Never Smoker   . Smokeless tobacco: Never Used  . Alcohol Use: No  . Drug Use: No  . Sexual Activity:    Partners: Male   Other Topics Concern  . Not on file   Social History Narrative    Review of Systems  Genitourinary:       Lump under right axilla  All other systems reviewed and are  negative.   PHYSICAL EXAMINATION:    BP 100/66 mmHg  Pulse 64  Resp 14  Wt 176 lb (79.833 kg)  LMP 02/04/2015    General appearance: alert, cooperative and appears stated age Breasts: She is tender in bilateral upper outer breast tissue, near the axilla. No masses, dimpling. Lymph: no axillary or supraclavicular adenopathy.  ASSESSMENT Mastalgia    PLAN Stop caffeine intake Can try evening primrose oil and vit e F/u in 1 month for an annual exam   An After Visit Summary was printed and given to the patient.

## 2015-07-11 ENCOUNTER — Encounter: Payer: Self-pay | Admitting: Obstetrics and Gynecology

## 2015-07-11 ENCOUNTER — Ambulatory Visit (INDEPENDENT_AMBULATORY_CARE_PROVIDER_SITE_OTHER): Payer: BC Managed Care – PPO | Admitting: Obstetrics and Gynecology

## 2015-07-11 VITALS — BP 110/70 | HR 80 | Resp 14 | Ht 66.5 in | Wt 175.0 lb

## 2015-07-11 DIAGNOSIS — R5383 Other fatigue: Secondary | ICD-10-CM | POA: Diagnosis not present

## 2015-07-11 DIAGNOSIS — L680 Hirsutism: Secondary | ICD-10-CM

## 2015-07-11 DIAGNOSIS — N921 Excessive and frequent menstruation with irregular cycle: Secondary | ICD-10-CM | POA: Diagnosis not present

## 2015-07-11 DIAGNOSIS — Z01419 Encounter for gynecological examination (general) (routine) without abnormal findings: Secondary | ICD-10-CM

## 2015-07-11 DIAGNOSIS — L659 Nonscarring hair loss, unspecified: Secondary | ICD-10-CM | POA: Diagnosis not present

## 2015-07-11 LAB — CBC
HEMATOCRIT: 39.6 % (ref 36.0–46.0)
HEMOGLOBIN: 13.1 g/dL (ref 12.0–15.0)
MCH: 29.6 pg (ref 26.0–34.0)
MCHC: 33.1 g/dL (ref 30.0–36.0)
MCV: 89.6 fL (ref 78.0–100.0)
MPV: 10.1 fL (ref 8.6–12.4)
Platelets: 318 10*3/uL (ref 150–400)
RBC: 4.42 MIL/uL (ref 3.87–5.11)
RDW: 13 % (ref 11.5–15.5)
WBC: 5.6 10*3/uL (ref 4.0–10.5)

## 2015-07-11 MED ORDER — NAPROXEN SODIUM 550 MG PO TABS: ORAL_TABLET | ORAL | Status: DC

## 2015-07-11 NOTE — Progress Notes (Signed)
Patient ID: Ashley Guerra, female   DOB: 03-19-1986, 30 y.o.   MRN: [id number] 30 y.o. G0P0000 SingleCaucasianF here for annual exam. Her cycles have gotten heavier in the last 6-12 months. She can saturate a tampon in 3-4 hours. Her cramps have also gotten worse. Getting married in May, no plans for pregnancy in the immediate future. She has been on OCP's in the past, didn't like it. Her cycles are up to 6 days late over the last several months. She has a h/o a tiny stable nodule. She c/o increased hair loss, no thin spots. She also c/o dry skin and fatigue, no constipation. She c/o hair growth on her chin and around her nipples, also on her lower abdomen. Sexually active, intermittent, tolerable. She isn't sure if it's deep or entry.  Period Duration (Days): 7 days - heavy first 3 days  Period Pattern: (!) Irregular Menstrual Flow: Heavy, Moderate Menstrual Control: Tampon, Maxi pad Menstrual Control Change Freq (Hours): changes pad and tampon every 2 hours on heavy days Dysmenorrhea: (!) Severe Dysmenorrhea Symptoms: Cramping  Patient's last menstrual period was 06/22/2015.          Sexually active: Yes.    The current method of family planning is none.    Exercising: Yes.    walking Smoker:  no  Health Maintenance: Pap:  12-15-13 WNL History of abnormal Pap:  no MMG:  N/A Colonoscopy:  N/A BMD:   N/A TDaP:  05/21/08 Gardasil: no   reports that she has never smoked. She has never used smokeless tobacco. She reports that she does not drink alcohol or use illicit drugs. She is an early Paramedic.   Past Medical History  Diagnosis Date  . Palpitations   . Thyroid nodule 6/12    benign    Past Surgical History  Procedure Laterality Date  . Urethral dilation      age 23 & 16    No current outpatient prescriptions on file.   No current facility-administered medications for this visit.    Family History  Problem Relation Age of Onset  . Hypertension  Mother   . Heart failure Father   . Arrhythmia Father   . Breast cancer Maternal Grandmother   . Thyroid disease Sister     Review of Systems  Constitutional: Negative.   HENT: Negative.   Eyes: Negative.   Respiratory: Negative.   Cardiovascular: Negative.   Gastrointestinal: Negative.   Endocrine: Negative.        Hair loss  Genitourinary: Positive for menstrual problem.       Painful menstrual cycles bloating  Musculoskeletal: Negative.   Skin: Negative.   Allergic/Immunologic: Negative.   Neurological: Negative.   Psychiatric/Behavioral: Negative.     Exam:   BP 110/70 mmHg  Pulse 80  Resp 14  Ht 5' 6.5" (1.689 m)  Wt 175 lb (79.379 kg)  BMI 27.83 kg/m2  LMP 06/22/2015  Weight change: @ Height:   Height: 5' 6.5" (168.9 cm)  Ht Readings from Last 3 Encounters:  07/11/15 5' 6.5" (1.689 m)  03/03/14  (1.676 m)  02/08/14  (1.676 m)    General appearance: alert, cooperative and appears stated age Head: Normocephalic, without obvious abnormality, atraumatic Neck: no adenopathy, supple, symmetrical, trachea midline and thyroid normal to inspection and palpation Lungs: clear to auscultation bilaterally Breasts: normal appearance, no masses or tenderness Heart: regular rate and rhythm Abdomen: soft, non-tender; bowel sounds normal; no masses,  no organomegaly Extremities: extremities normal,  atraumatic, no cyanosis or edema Skin: Skin color, texture, turgor normal. No rashes or lesions. No obvious hirsutism, but she gets rid of it. Lymph nodes: Cervical, supraclavicular, and axillary nodes normal. No abnormal inguinal nodes palpated Neurologic: Grossly normal   Pelvic: External genitalia:  no lesions              Urethra:  normal appearing urethra with no masses, tenderness or lesions              Bartholins and Skenes: normal                 Vagina: normal appearing vagina with normal color and discharge, no lesions              Cervix: no  lesions               Bimanual Exam:  Uterus:  normal size, contour, position, consistency, mobility, non-tender              Adnexa: no mass, fullness, tenderness               Rectovaginal: Confirms               Anus:  normal sphincter tone, no lesions  Chaperone was present for exam.  A:  Well Woman with normal exam  Change in cycles, further apart, heavier  Worsening dysmenorrhea  Contraception, using condoms, not interested in other forms of contraception  Hirsutism  P:   TSH, CBC  Hirsutism labs  No pap this year  Anaprox for cramps  Discussed breast self exam, calcium and vit D

## 2015-07-11 NOTE — Patient Instructions (Signed)

## 2015-07-12 LAB — DHEA-SULFATE: DHEA SO4: 196 ug/dL (ref 18–391)

## 2015-07-12 LAB — TESTOSTERONE: TESTOSTERONE: 36 ng/dL

## 2015-07-12 LAB — TSH: TSH: 1.73 mIU/L

## 2015-07-15 LAB — 17-HYDROXYPROGESTERONE: 17-OH-Progesterone, LC/MS/MS: 31 ng/dL

## 2016-07-12 ENCOUNTER — Ambulatory Visit: Payer: BC Managed Care – PPO | Admitting: Obstetrics and Gynecology

## 2019-06-09 DIAGNOSIS — Z3490 Encounter for supervision of normal pregnancy, unspecified, unspecified trimester: Secondary | ICD-10-CM | POA: Insufficient documentation

## 2023-12-19 ENCOUNTER — Ambulatory Visit: Payer: Self-pay | Admitting: Family Medicine

## 2023-12-27 ENCOUNTER — Ambulatory Visit
Admission: EM | Admit: 2023-12-27 | Discharge: 2023-12-27 | Disposition: A | Discharge status: Home / Self Care | Attending: Emergency Medicine | Admitting: Emergency Medicine

## 2023-12-27 DIAGNOSIS — H6692 Otitis media, unspecified, left ear: Secondary | ICD-10-CM

## 2023-12-27 MED ORDER — AMOXICILLIN 875 MG PO TABS: 875.0000 mg | ORAL_TABLET | Freq: Two times a day (BID) | ORAL | 0 refills | Status: AC

## 2023-12-27 NOTE — ED Provider Notes (Signed)
 Ashley Guerra    CSN: [id number] Arrival date & time: 12/27/23  9183      History   Chief Complaint Chief Complaint  Patient presents with   Otalgia    HPI Ashley Guerra is a 38 y.o. female.  Patient presents with left ear pain x 1 day.  She recently had upper respiratory symptoms, including congestion and runny nose, but these have resolved.  She denies fever, chills, sore throat, cough, shortness of breath.  No OTC medication taken today.  The history is provided by the patient and medical records.    Past Medical History:  Diagnosis Date   Palpitations    Thyroid  nodule 6/12   benign    Patient Active Problem List   Diagnosis Date Noted   Palpitations 09/13/2013    Past Surgical History:  Procedure Laterality Date   URETHRAL DILATION     age 54 & 83    OB History     Gravida  0   Para  0   Term  0   Preterm  0   AB  0   Living  0      SAB  0   IAB  0   Ectopic  0   Multiple  0   Live Births               Home Medications    Prior to Admission medications   Medication Sig Start Date End Date Taking? Authorizing Provider  amoxicillin  (AMOXIL ) 875 MG tablet Take 1 tablet (875 mg total) by mouth 2 (two) times daily for 10 days. 12/27/23 01/06/24 Yes Corlis Burnard DEL, NP  naproxen  sodium (ANAPROX  DS) 550 MG tablet 1 tab po q 12 hours prn Patient not taking: Reported on 12/27/2023 07/11/15   Jertson, Jill Evelyn, MD    Family History Family History  Problem Relation Age of Onset   Hypertension Mother    Heart failure Father    Arrhythmia Father    Breast cancer Maternal Grandmother    Thyroid  disease Sister     Social History Social History   Tobacco Use   Smoking status: Never   Smokeless tobacco: Never  Substance Use Topics   Alcohol use: No    Alcohol/week: 0.0 standard drinks of alcohol   Drug use: No     Allergies   Patient has no known allergies.   Review of Systems Review of Systems  Constitutional:   Negative for chills and fever.  HENT:  Positive for ear pain. Negative for sore throat.   Respiratory:  Negative for cough and shortness of breath.      Physical Exam Triage Vital Signs ED Triage Vitals  Encounter Vitals Group     BP      Girls Systolic BP Percentile      Girls Diastolic BP Percentile      Boys Systolic BP Percentile      Boys Diastolic BP Percentile      Pulse      Resp      Temp      Temp src      SpO2      Weight      Height      Head Circumference      Peak Flow      Pain Score      Pain Loc      Pain Education      Exclude from Growth Chart    No data  found.  Updated Vital Signs BP 116/80   Pulse 97   Temp 98 F (36.7 C)   Resp 18   LMP 12/23/2023   SpO2 98%   Visual Acuity Right Eye Distance:   Left Eye Distance:   Bilateral Distance:    Right Eye Near:   Left Eye Near:    Bilateral Near:     Physical Exam Constitutional:      General: She is not in acute distress. HENT:     Right Ear: Tympanic membrane and ear canal normal.     Left Ear: Ear canal normal. Tympanic membrane is erythematous.     Nose: Nose normal.     Mouth/Throat:     Mouth: Mucous membranes are moist.     Pharynx: Oropharynx is clear.  Cardiovascular:     Rate and Rhythm: Normal rate and regular rhythm.     Heart sounds: Normal heart sounds.  Pulmonary:     Effort: Pulmonary effort is normal. No respiratory distress.     Breath sounds: Normal breath sounds.  Neurological:     Mental Status: She is alert.      UC Treatments / Results  Labs (all labs ordered are listed, but only abnormal results are displayed) Labs Reviewed - No data to display  EKG   Radiology No results found.  Procedures Procedures (including critical care time)  Medications Ordered in UC Medications - No data to display  Initial Impression / Assessment and Plan / UC Course  I have reviewed the triage vital signs and the nursing notes.  Pertinent labs & imaging results  that were available during my care of the patient were reviewed by me and considered in my medical decision making (see chart for details).    Left otitis media.  Treating with amoxicillin .  Tylenol or ibuprofen as needed.  Education provided on otitis media.  Instructed patient to follow up with her PCP if her symptoms are not improving.  She agrees to plan of care.   Final Clinical Impressions(s) / UC Diagnoses   Final diagnoses:  Left otitis media, unspecified otitis media type     Discharge Instructions      Take the amoxicillin  as directed.  Follow-up with your primary care provider if your symptoms are not improving.      ED Prescriptions     Medication Sig Dispense Auth. Provider   amoxicillin  (AMOXIL ) 875 MG tablet Take 1 tablet (875 mg total) by mouth 2 (two) times daily for 10 days. 20 tablet Corlis Burnard DEL, NP      PDMP not reviewed this encounter.   Corlis Burnard DEL, NP 12/27/23 [phone]

## 2023-12-27 NOTE — Discharge Instructions (Addendum)
 Take the amoxicillin as directed.  Follow up with your primary care provider if your symptoms are not improving.

## 2023-12-27 NOTE — ED Triage Notes (Signed)
 Patient to Urgent Care with complaints of left sided ear pain that started yesterday. Denies any fevers.   Reports URI symptoms that started Sunday.   No OTC meds.

## 2024-01-08 ENCOUNTER — Ambulatory Visit: Payer: Self-pay

## 2024-01-08 ENCOUNTER — Ambulatory Visit
Admission: EM | Admit: 2024-01-08 | Discharge: 2024-01-08 | Disposition: A | Discharge status: Home / Self Care | Attending: Emergency Medicine | Admitting: Emergency Medicine

## 2024-01-08 ENCOUNTER — Encounter: Payer: Self-pay | Admitting: Emergency Medicine

## 2024-01-08 DIAGNOSIS — H9202 Otalgia, left ear: Secondary | ICD-10-CM | POA: Diagnosis not present

## 2024-01-08 MED ORDER — CEFDINIR 300 MG PO CAPS
300.0000 mg | ORAL_CAPSULE | Freq: Two times a day (BID) | ORAL | 0 refills | Status: AC
Start: 2024-01-08 — End: 2024-01-15

## 2024-01-08 NOTE — Discharge Instructions (Signed)
 On exam your ear does not appear to be infected but as you are still symptomatic after recent treatment of ear infection we will initiate additional antibiotics to make sure all symptoms resolved  Take cefdinir  twice daily for 7 days, you should begin to see improvement after 48 hours of medication use and then it should progressively get better  You may use Tylenol or ibuprofen for management of discomfort  May hold warm compresses to the ear for additional comfort  Please not attempted any ear cleaning or object or fluid placement into the ear canal to prevent further irritation

## 2024-01-08 NOTE — ED Provider Notes (Signed)
 Ashley Guerra    CSN: [id number] Arrival date & time: 01/08/24  1759      History   Chief Complaint Chief Complaint  Patient presents with   Otalgia    HPI Ashley Guerra is a 38 y.o. female.   Patient presents for evaluation left-sided ear pain beginning 2 days ago.  Completed amoxicillin  1 day prior for treatment of a left-sided ear infection.  Endorses pain, fullness, sensation of fluid and pressure.  Has not attempted additional treatment.   Past Medical History:  Diagnosis Date   Palpitations    Thyroid  nodule 6/12   benign    Patient Active Problem List   Diagnosis Date Noted   Palpitations 09/13/2013    Past Surgical History:  Procedure Laterality Date   URETHRAL DILATION     age 29 & 38    OB History     Gravida  0   Para  0   Term  0   Preterm  0   AB  0   Living  0      SAB  0   IAB  0   Ectopic  0   Multiple  0   Live Births               Home Medications    Prior to Admission medications   Medication Sig Start Date End Date Taking? Authorizing Provider  cefdinir  (OMNICEF ) 300 MG capsule Take 1 capsule (300 mg total) by mouth 2 (two) times daily for 7 days. 01/08/24 01/15/24 Yes Teresa Shelba SAUNDERS, NP  naproxen  sodium (ANAPROX  DS) 550 MG tablet 1 tab po q 12 hours prn Patient not taking: Reported on 12/27/2023 07/11/15   Jertson, Jill Evelyn, MD    Family History Family History  Problem Relation Age of Onset   Hypertension Mother    Heart failure Father    Arrhythmia Father    Breast cancer Maternal Grandmother    Thyroid  disease Sister     Social History Social History   Tobacco Use   Smoking status: Never   Smokeless tobacco: Never  Substance Use Topics   Alcohol use: No    Alcohol/week: 0.0 standard drinks of alcohol   Drug use: No     Allergies   Patient has no known allergies.   Review of Systems Review of Systems   Physical Exam Triage Vital Signs ED Triage Vitals  Encounter Vitals  Group     BP 01/08/24 1812 122/82     Girls Systolic BP Percentile --      Girls Diastolic BP Percentile --      Boys Systolic BP Percentile --      Boys Diastolic BP Percentile --      Pulse Rate 01/08/24 1812 100     Resp 01/08/24 1812 18     Temp --      Temp Source 01/08/24 1812 Oral     SpO2 01/08/24 1812 98 %     Weight --      Height --      Head Circumference --      Peak Flow --      Pain Score 01/08/24 1809 2     Pain Loc --      Pain Education --      Exclude from Growth Chart --    No data found.  Updated Vital Signs BP 122/82 (BP Location: Left Arm)   Pulse 100   Resp 18  LMP 12/23/2023 (Exact Date)   SpO2 98%   Visual Acuity Right Eye Distance:   Left Eye Distance:   Bilateral Distance:    Right Eye Near:   Left Eye Near:    Bilateral Near:     Physical Exam Constitutional:      Appearance: Normal appearance.  HENT:     Right Ear: Tympanic membrane, ear canal and external ear normal.     Left Ear: Tympanic membrane, ear canal and external ear normal.  Eyes:     Extraocular Movements: Extraocular movements intact.  Pulmonary:     Effort: Pulmonary effort is normal.  Neurological:     Mental Status: She is alert and oriented to person, place, and time. Mental status is at baseline.      UC Treatments / Results  Labs (all labs ordered are listed, but only abnormal results are displayed) Labs Reviewed - No data to display  EKG   Radiology No results found.  Procedures Procedures (including critical care time)  Medications Ordered in UC Medications - No data to display  Initial Impression / Assessment and Plan / UC Course  I have reviewed the triage vital signs and the nursing notes.  Pertinent labs & imaging results that were available during my care of the patient were reviewed by me and considered in my medical decision making (see chart for details).  Otalgia of left ear  No erythema, drainage or swelling within the ear canal  or abnormality present to the tympanic membrane however as patient was treated for ear infection within the last week and all symptoms have not resolved we will move forward with additional antibiotic treatment, prescribed cefdinir  and discussed administration recommended over-the-counter analgesics and nonpharmacological supportive care, advised follow-up for persisting symptoms Final Clinical Impressions(s) / UC Diagnoses   Final diagnoses:  Otalgia, left ear     Discharge Instructions      On exam your ear does not appear to be infected but as you are still symptomatic after recent treatment of ear infection we will initiate additional antibiotics to make sure all symptoms resolved  Take cefdinir  twice daily for 7 days, you should begin to see improvement after 48 hours of medication use and then it should progressively get better  You may use Tylenol or ibuprofen for management of discomfort  May hold warm compresses to the ear for additional comfort  Please not attempted any ear cleaning or object or fluid placement into the ear canal to prevent further irritation    ED Prescriptions     Medication Sig Dispense Auth. Provider   cefdinir  (OMNICEF ) 300 MG capsule Take 1 capsule (300 mg total) by mouth 2 (two) times daily for 7 days. 14 capsule Elian Gloster, Shelba SAUNDERS, NP      PDMP not reviewed this encounter.   Teresa Shelba SAUNDERS, [place] 01/08/24 [phone]

## 2024-01-08 NOTE — Telephone Encounter (Signed)
 Copied from CRM #[id number]. Topic: Clinical - Red Word Triage >> Jan 08, 2024  9:11 AM Mesmerise C wrote: [facility] that prompted transfer to Nurse Triage: Patient stated she's still having ear pain still feels fluid, and when laying down there's irritation no improvement with antibiotics Reason for Disposition  Earache lasts < 60 minutes  Answer Assessment - Initial Assessment Questions Pt went to UC on 8/8. Pt finished abx after 10 day course. Pt feels fluid pressure and my ear feels irrigated when lie down. RN gave home care advice and to seek care at [facility] if symptoms worsen     1. LOCATION: Which ear is involved?     Left ear 2. ONSET: When did the ear pain start?      8/8 3. SEVERITY: How bad is the pain?  (Scale 1-10; mild, moderate or severe)     1 4. URI SYMPTOMS: Do you have a runny nose or cough?     denies 5. FEVER: Do you have a fever? If Yes, ask: What is your temperature, how was it measured, and when did it start?     denies 6. CAUSE: Have you been swimming recently?, How often do you use Q-TIPS?, Have you had any recent air travel or scuba diving?     na 7. OTHER SYMPTOMS: Do you have any other symptoms? (e.g., decreased hearing, dizziness, headache, stiff neck, vomiting)     denies  Protocols used: Earache-A-AH

## 2024-01-08 NOTE — ED Triage Notes (Signed)
 Patient was seen here  at [facility] on 12/27/18 with left ear pain. Patient was diagnosed with ear infection. Patient stated she was placed on antibiotics. Patient states she finished and Amoxicillin  on Sunday. Patient reports left ear pain returned on Monday.  Rates pain 2/10.

## 2024-03-16 ENCOUNTER — Ambulatory Visit: Payer: Self-pay | Admitting: Family Medicine

## 2024-03-16 ENCOUNTER — Encounter: Payer: Self-pay | Admitting: Family Medicine

## 2024-03-16 VITALS — BP 110/78 | HR 68 | Temp 98.2°F | Ht 66.5 in | Wt 197.2 lb

## 2024-03-16 DIAGNOSIS — E049 Nontoxic goiter, unspecified: Secondary | ICD-10-CM | POA: Diagnosis not present

## 2024-03-16 DIAGNOSIS — E7800 Pure hypercholesterolemia, unspecified: Secondary | ICD-10-CM | POA: Diagnosis not present

## 2024-03-16 DIAGNOSIS — Z8249 Family history of ischemic heart disease and other diseases of the circulatory system: Secondary | ICD-10-CM | POA: Diagnosis not present

## 2024-03-16 DIAGNOSIS — Z808 Family history of malignant neoplasm of other organs or systems: Secondary | ICD-10-CM

## 2024-03-16 DIAGNOSIS — Z8349 Family history of other endocrine, nutritional and metabolic diseases: Secondary | ICD-10-CM

## 2024-03-16 DIAGNOSIS — E559 Vitamin D deficiency, unspecified: Secondary | ICD-10-CM | POA: Diagnosis not present

## 2024-03-16 DIAGNOSIS — Z124 Encounter for screening for malignant neoplasm of cervix: Secondary | ICD-10-CM | POA: Insufficient documentation

## 2024-03-16 LAB — CBC WITH DIFFERENTIAL/PLATELET
Basophils Absolute: 0.1 K/uL (ref 0.0–0.1)
Basophils Relative: 1 % (ref 0.0–3.0)
Eosinophils Absolute: 0.3 K/uL (ref 0.0–0.7)
Eosinophils Relative: 4.1 % (ref 0.0–5.0)
HCT: 39.3 % (ref 36.0–46.0)
Hemoglobin: 12.9 g/dL (ref 12.0–15.0)
Lymphocytes Relative: 21.2 % (ref 12.0–46.0)
Lymphs Abs: 1.5 K/uL (ref 0.7–4.0)
MCHC: 32.9 g/dL (ref 30.0–36.0)
MCV: 86.2 fl (ref 78.0–100.0)
Monocytes Absolute: 0.4 K/uL (ref 0.1–1.0)
Monocytes Relative: 6.2 % (ref 3.0–12.0)
Neutro Abs: 4.7 K/uL (ref 1.4–7.7)
Neutrophils Relative %: 67.5 % (ref 43.0–77.0)
Platelets: 305 K/uL (ref 150.0–400.0)
RBC: 4.56 Mil/uL (ref 3.87–5.11)
RDW: 13.8 % (ref 11.5–15.5)
WBC: 7 K/uL (ref 4.0–10.5)

## 2024-03-16 LAB — COMPREHENSIVE METABOLIC PANEL WITH GFR
ALT: 16 U/L (ref 0–35)
AST: 16 U/L (ref 0–37)
Albumin: 4.6 g/dL (ref 3.5–5.2)
Alkaline Phosphatase: 77 U/L (ref 39–117)
BUN: 10 mg/dL (ref 6–23)
CO2: 27 meq/L (ref 19–32)
Calcium: 9.3 mg/dL (ref 8.4–10.5)
Chloride: 103 meq/L (ref 96–112)
Creatinine, Ser: 0.58 mg/dL (ref 0.40–1.20)
GFR: 115.69 mL/min (ref >60.00)
Glucose, Bld: 77 mg/dL (ref 70–99)
Potassium: 3.6 meq/L (ref 3.5–5.1)
Sodium: 138 meq/L (ref 135–145)
Total Bilirubin: 0.5 mg/dL (ref 0.2–1.2)
Total Protein: 7.6 g/dL (ref 6.0–8.3)

## 2024-03-16 LAB — LIPID PANEL
Cholesterol: 166 mg/dL (ref 0–200)
HDL: 39.5 mg/dL (ref >39.00)
LDL Cholesterol: 109 mg/dL — ABNORMAL HIGH (ref 0–99)
NonHDL: 126.55
Total CHOL/HDL Ratio: 4
Triglycerides: 90 mg/dL (ref 0.0–149.0)
VLDL: 18 mg/dL (ref 0.0–40.0)

## 2024-03-16 LAB — VITAMIN D 25 HYDROXY (VIT D DEFICIENCY, FRACTURES): VITD: 28.44 ng/mL — ABNORMAL LOW (ref 30.00–100.00)

## 2024-03-16 LAB — VITAMIN B12: Vitamin B-12: 363 pg/mL (ref 211–911)

## 2024-03-16 LAB — TSH: TSH: 2.81 u[IU]/mL (ref 0.35–5.50)

## 2024-03-16 NOTE — Progress Notes (Signed)
 New Patient Visit  Subjective:     Patient ID: Ashley Guerra, female    DOB: 07/04/1985, 38 y.o.   MRN: [id number]  Chief Complaint  Patient presents with   Establish Care    HPI  Discussed the use of AI scribe software for clinical note transcription with the patient, who gave verbal consent to proceed.  History of Present Illness Ashley Guerra is a 38 year old female who presents with concerns about elevated blood pressure and heart palpitations.  Hypertension and palpitations - Elevated blood pressure readings, recently in the 120s, which is higher than her baseline - Heart palpitations, particularly at night - Occasional lightheadedness - No abnormalities on heart monitor and ultrasound performed last year  Thyroid  concerns - Concern for thyroid  dysfunction due to family history and personal symptoms - History of a large thyroid  and cysts that resolved spontaneously - Difficulty losing weight despite efforts - Family history: mother with Graves' disease, sister with thyroid  cancer at age 50  Hormonal imbalance - Laboratory results in April showed low vitamin D, progesterone, and estrogen levels - Takes a multivitamin with vitamin D and an additional vitamin D supplement - Uses essential oils for hormonal balance, which she feels have improved her periods and mood swings  Sleep disruption - Sleep disrupted by young children, ages four and six, who occasionally wake her at night - Recent transition from co-sleeping to children sleeping in their own room, which has been an adjustment  Family history of cardiac disease - Father had an unexplained heart issue at age 75 requiring a defibrillator     ROS Per HPI  Outpatient Encounter Medications as of 03/16/2024  Medication Sig   naproxen  sodium (ANAPROX  DS) 550 MG tablet 1 tab po q 12 hours prn (Patient not taking: Reported on 03/16/2024)   No facility-administered encounter medications on file as of  03/16/2024.    Past Medical History:  Diagnosis Date   Palpitations    Thyroid  nodule 10/20/2010   benign    Past Surgical History:  Procedure Laterality Date   URETHRAL DILATION     age 33 & 66    Family History  Problem Relation Age of Onset   Hypertension Mother    Heart failure Father    Arrhythmia Father    Breast cancer Maternal Grandmother    Thyroid  disease Sister     Social History   Socioeconomic History   Marital status: Married    Spouse name: Not on file   Number of children: Not on file   Years of education: Not on file   Highest education level: Not on file  Occupational History   Not on file  Tobacco Use   Smoking status: Never   Smokeless tobacco: Never  Substance and Sexual Activity   Alcohol use: No    Alcohol/week: 0.0 standard drinks of alcohol   Drug use: No   Sexual activity: Yes    Partners: Male  Other Topics Concern   Not on file  Social History Narrative   Not on file   Social Drivers of Health   Financial Resource Strain: Not on file  Food Insecurity: Not on file  Transportation Needs: Not on file  Physical Activity: Not on file  Stress: Not on file  Social Connections: Not on file  Intimate Partner Violence: Not on file       Objective:    BP 110/78 (BP Location: Left Arm, Patient Position: Sitting, Cuff Size: Normal)  Pulse 68   Temp 98.2 F (36.8 C) (Temporal)   Ht 5' 6.5 (1.689 m)   Wt 197 lb 3.2 oz (89.4 kg)   LMP 03/16/2024 (Exact Date)   SpO2 98%   BMI 31.35 kg/m    Physical Exam Vitals and nursing note reviewed.  Constitutional:      General: She is not in acute distress.    Appearance: Normal appearance. She is normal weight.  HENT:     Head: Normocephalic and atraumatic.     Right Ear: External ear normal.     Left Ear: External ear normal.     Nose: Nose normal.     Mouth/Throat:     Mouth: Mucous membranes are moist.     Pharynx: Oropharynx is clear.  Eyes:     Extraocular Movements:  Extraocular movements intact.     Pupils: Pupils are equal, round, and reactive to light.  Cardiovascular:     Rate and Rhythm: Normal rate and regular rhythm.     Pulses: Normal pulses.     Heart sounds: Normal heart sounds.  Pulmonary:     Effort: Pulmonary effort is normal. No respiratory distress.     Breath sounds: Normal breath sounds. No wheezing, rhonchi or rales.  Musculoskeletal:        General: Normal range of motion.     Cervical back: Normal range of motion.     Right lower leg: No edema.     Left lower leg: No edema.  Lymphadenopathy:     Cervical: No cervical adenopathy.  Neurological:     General: No focal deficit present.     Mental Status: She is alert and oriented to person, place, and time.  Psychiatric:        Mood and Affect: Mood normal.        Thought Content: Thought content normal.     Results for orders placed or performed in visit on 03/16/24  CBC with Differential/Platelet  Result Value Ref Range   WBC 7.0 4.0 - 10.5 K/uL   RBC 4.56 3.87 - 5.11 Mil/uL   Hemoglobin 12.9 12.0 - 15.0 g/dL   HCT 60.6 63.9 - 53.9 %   MCV 86.2 78.0 - 100.0 fl   MCHC 32.9 30.0 - 36.0 g/dL   RDW 86.1 88.4 - 84.4 %   Platelets 305.0 150.0 - 400.0 K/uL   Neutrophils Relative % 67.5 43.0 - 77.0 %   Lymphocytes Relative 21.2 12.0 - 46.0 %   Monocytes Relative 6.2 3.0 - 12.0 %   Eosinophils Relative 4.1 0.0 - 5.0 %   Basophils Relative 1.0 0.0 - 3.0 %   Neutro Abs 4.7 1.4 - 7.7 K/uL   Lymphs Abs 1.5 0.7 - 4.0 K/uL   Monocytes Absolute 0.4 0.1 - 1.0 K/uL   Eosinophils Absolute 0.3 0.0 - 0.7 K/uL   Basophils Absolute 0.1 0.0 - 0.1 K/uL  Comprehensive metabolic panel with GFR  Result Value Ref Range   Sodium 138 135 - 145 mEq/L   Potassium 3.6 3.5 - 5.1 mEq/L   Chloride 103 96 - 112 mEq/L   CO2 27 19 - 32 mEq/L   Glucose, Bld 77 70 - 99 mg/dL   BUN 10 6 - 23 mg/dL   Creatinine, Ser 9.41 0.40 - 1.20 mg/dL   Total Bilirubin 0.5 0.2 - 1.2 mg/dL   Alkaline Phosphatase  77 39 - 117 U/L   AST 16 0 - 37 U/L   ALT 16 0 -  35 U/L   Total Protein 7.6 6.0 - 8.3 g/dL   Albumin 4.6 3.5 - 5.2 g/dL   GFR 884.30 >39.99 mL/min   Calcium 9.3 8.4 - 10.5 mg/dL  Lipid panel  Result Value Ref Range   Cholesterol 166 0 - 200 mg/dL   Triglycerides 09.9 0.0 - 149.0 mg/dL   HDL 60.49 >60.99 mg/dL   VLDL 81.9 0.0 - 59.9 mg/dL   LDL Cholesterol 890 (H) 0 - 99 mg/dL   Total CHOL/HDL Ratio 4    NonHDL 126.55   TSH  Result Value Ref Range   TSH 2.81 0.35 - 5.50 uIU/mL  VITAMIN D 25 Hydroxy (Vit-D Deficiency, Fractures)  Result Value Ref Range   VITD 28.44 (L) 30.00 - 100.00 ng/mL  Vitamin B12  Result Value Ref Range   Vitamin B-12 363 211 - 911 pg/mL        Assessment & Plan:   Assessment and Plan Assessment & Plan Family history of thyroid  cancer, family history Graves' disease family history of thyroid  issues. Symptoms suggest possible thyroid  dysfunction. - Order thyroid  ultrasound. - Review previous thyroid  function tests.  Enlarged thyroid  - Enlarged thyroid , nontender, non nodular - Thyroid  ultrasound for further eval  Family history early CAD Palpitations and lightheadedness Palpitations at night and lightheadedness upon waking. Possible thyroid  or blood pressure related. - Evaluate thyroid  function as potential cause. - Cardiology referral today  Vitamin D deficiency Previously low vitamin D levels. Currently on supplementation. - Continue current vitamin D supplementation.  Cervical cancer screening - Referral to OB/GYN today  Elevated LDL - Lipids today    Orders Placed This Encounter  Procedures   US  THYROID     [facility] [id number] EPIC ORDER WT: 198 NO NEEDS/NOTHING ATTACHED  PT AWARE OF $75 NO SHOW FEE AND BRING PHOTO ID & INS CARD  DMS W/ PT     Standing Status:   Future    Expiration Date:   03/16/2025    Reason for Exam (SYMPTOM  OR DIAGNOSIS REQUIRED):   enlarged thyroid , fam hx thyroid  cx    Preferred imaging location?:    [facility]   CBC with Differential/Platelet    Release to patient:   Immediate [1]   Comprehensive metabolic panel with GFR    Release to patient:   Immediate [1]   Lipid panel   TSH   VITAMIN D 25 Hydroxy (Vit-D Deficiency, Fractures)   Vitamin B12   Ambulatory referral to Obstetrics / Gynecology    Referral Priority:   Routine    Referral Type:   Consultation    Referral Reason:   Specialty Services Required    Requested Specialty:   Obstetrics and Gynecology    Number of Visits Requested:   1   Ambulatory referral to Cardiology    Referral Priority:   Routine    Referral Type:   Consultation    Referral Reason:   Specialty Services Required    Number of Visits Requested:   1     No orders of the defined types were placed in this encounter.   Return in about 6 months (around 09/14/2024) for cpe.  Corean LITTIE Ku, FNP

## 2024-03-16 NOTE — Patient Instructions (Addendum)
 Welcome to [facility]!  Thank you for choosing us  for your Primary Care needs.   We offer in person and video appointments for your convenience. You may call our office to schedule appointments, or you may schedule appointments with me through MyChart.   The best way to get in contact with me is via MyChart message. This will get to me faster than a phone call, unless there is an emergency, then please call 911.  The lab is located downstairs in the Sports Medicine building, we also have xray available there.   We are checking labs today, will be in contact with any results that require further attention  Thyroid  US  ordered, someone will be calling to get you scheduled.   Follow-up with me for new or worsening symptoms.

## 2024-03-17 ENCOUNTER — Ambulatory Visit: Payer: Self-pay | Admitting: Family Medicine

## 2024-03-17 DIAGNOSIS — E559 Vitamin D deficiency, unspecified: Secondary | ICD-10-CM

## 2024-03-17 MED ORDER — VITAMIN D (ERGOCALCIFEROL) 1.25 MG (50000 UNIT) PO CAPS: 50000.0000 [IU] | ORAL_CAPSULE | ORAL | 0 refills | Status: AC

## 2024-03-31 ENCOUNTER — Other Ambulatory Visit

## 2024-04-02 ENCOUNTER — Ambulatory Visit
Admission: RE | Admit: 2024-04-02 | Discharge: 2024-04-02 | Disposition: A | Discharge status: Home / Self Care | Source: Ambulatory Visit | Attending: Family Medicine | Admitting: Family Medicine

## 2024-04-02 DIAGNOSIS — E049 Nontoxic goiter, unspecified: Secondary | ICD-10-CM

## 2024-04-02 DIAGNOSIS — Z808 Family history of malignant neoplasm of other organs or systems: Secondary | ICD-10-CM

## 2024-04-03 ENCOUNTER — Encounter: Payer: Self-pay | Admitting: Family Medicine

## 2024-04-13 ENCOUNTER — Telehealth (HOSPITAL_BASED_OUTPATIENT_CLINIC_OR_DEPARTMENT_OTHER): Payer: Self-pay | Admitting: *Deleted

## 2024-04-13 ENCOUNTER — Ambulatory Visit (HOSPITAL_BASED_OUTPATIENT_CLINIC_OR_DEPARTMENT_OTHER): Admitting: Nurse Practitioner

## 2024-04-13 ENCOUNTER — Encounter (HOSPITAL_BASED_OUTPATIENT_CLINIC_OR_DEPARTMENT_OTHER): Payer: Self-pay | Admitting: Nurse Practitioner

## 2024-04-13 VITALS — BP 112/76 | HR 79 | Ht 66.0 in | Wt 197.0 lb

## 2024-04-13 DIAGNOSIS — R002 Palpitations: Secondary | ICD-10-CM | POA: Diagnosis not present

## 2024-04-13 DIAGNOSIS — Z8249 Family history of ischemic heart disease and other diseases of the circulatory system: Secondary | ICD-10-CM

## 2024-04-13 DIAGNOSIS — F419 Anxiety disorder, unspecified: Secondary | ICD-10-CM | POA: Diagnosis not present

## 2024-04-13 DIAGNOSIS — Z7189 Other specified counseling: Secondary | ICD-10-CM

## 2024-04-13 MED ORDER — PROPRANOLOL HCL 10 MG PO TABS: 10.0000 mg | ORAL_TABLET | Freq: Three times a day (TID) | ORAL | 11 refills | Status: AC | PRN

## 2024-04-13 NOTE — Progress Notes (Signed)
 Cardiology Office Note:  .   Date:  04/13/2024 ID:  Ashley Guerra Husband, DOB 16-Nov-1985, MRN [id number] PCP: Alvia Corean LITTIE, FNP [facility] Providers Cardiologist:  None   Patient Profile: .      PMH Palpitations Family history of early CAD Low vitamin d  level Enlarged thryoid       History of Present Illness: .    History of Present Illness Ashley Guerra is a very pleasant 38 year old female who presents with palpitations and a family history of heart disease. She experiences palpitations, described as a 'funny feeling' in her chest, often occurring the week before her menstrual period. Previous cardiac evaluations, including a three-week heart monitor and an echocardiogram last year, showed normal results. Family history is significant for heart disease. Her father required a defibrillator after a cardiac event, and her mother has had hypertension since age 45. She has 4 older siblings have varying health concerns, but no significant cardiac events. BP is up a little from  her normal, which was generally systolic in the 90s. Occasional lightheadedness, particularly when bending over, and swelling in her fingers in the morning. Her diet is rich in fruits and vegetables, and she avoids high-sugar drinks. She engages in physical activities like Zumba and walking with her children. No chest pain, shortness of breath, or syncope during exercise.   Family history: Her family history includes Arrhythmia in her father; Breast cancer in her maternal grandmother; Heart failure in her father; Hypertension in her mother; Thyroid  disease in her sister.   Discussed the use of AI scribe software for clinical note transcription with the patient, who gave verbal consent to proceed.  Diet: Crock pot meals frequently Avoids high sodium, high sugar foods  Activity: [employer] with her children  No results found for: LIPOA    ROS: See HPI       Studies Reviewed: SABRA    EKG Interpretation Date/Time:  Monday April 13 2024 09:19:11 EST Ventricular Rate:  79 PR Interval:  124 QRS Duration:  72 QT Interval:  378 QTC Calculation: 433 R Axis:   46  Text Interpretation: Normal sinus rhythm with sinus arrhythmia Low voltage QRS No previous ECGs available Confirmed by Percy Browning [phone]) on 04/13/2024 9:43:18 AM      Echo 04/2023     Risk Assessment/Calculations:             Physical Exam:   VS: BP 112/76 (BP Location: Left Arm, Patient Position: Sitting, Cuff Size: Normal)   Pulse 79   Ht 5' 6 (1.676 m)   Wt 197 lb (89.4 kg)   LMP 03/16/2024 (Exact Date)   SpO2 99%   BMI 31.80 kg/m   Wt Readings from Last 3 Encounters:  04/13/24 197 lb (89.4 kg)  03/16/24 197 lb 3.2 oz (89.4 kg)  07/11/15 175 lb (79.4 kg)     GEN: Well nourished, well developed in no acute distress NECK: No JVD; No carotid bruits CARDIAC: RRR, no murmurs, rubs, gallops RESPIRATORY:  Clear to auscultation without rales, wheezing or rhonchi  ABDOMEN: Soft, non-tender, non-distended EXTREMITIES:  No edema; No deformity     ASSESSMENT AND PLAN: .    Assessment & Plan Palpitations   Anxiety  Intermittent palpitations noted, worse just prior to her menstrual cycle.  She wore a cardiac monitor  02/2023 with sinus rhythm, rare PACs/PVCs, no atrial fibrillation, no SVT. Echo 04/2023 with normal heart function, no significant valve disease. Metabolic panel 03/16/24 revealed  potassium of 3.6.  Advise lower potassium may contribute to worsening palpitations. Encouraged her to increase dietary intake of potassium rich foods. She asks contribution of anxiety to worsening palpitations, but prefers not taking anti-anxiety medication daily. Discussed the potential benefit of as needed propranolol .  - Start propranolol  10 mg as needed, up to three times daily as needed for palpitations - Increase dietary potassium intake and consider sugar-free electrolyte supplements like  Gatorlyte  - Consider repeat monitor if symptoms persist  Cardiac risk Family history heart disease Family history of sudden collapse during stress test for her father who subsequently had ICD implant and is doing well. LDL is at 109 mg/dL, slightly above target due to family history. Advised goal LDL 100 or lower.  History of echo with normal LVEF, no significant valve disease, and cardiac monitor that did not reveal any concerning arrhythmias. EKG today reveals low voltage QRS, no prior EKG for comparison. Lengthy discussion about continued follow-up and potential for additional future testing including CT calcium score in the future. - Continue healthy diet avoiding processed foods, saturated fat, sugar, and other simple carbohydrates - Aim for at least 150 minutes of moderate intensity exercise each week       Dispo: 2-3 years with me  Signed, Rosaline Bane, NP-C

## 2024-04-13 NOTE — Patient Instructions (Signed)
 Medication Instructions:   START Take 1 tablet (10 mg total) by mouth 3 (three) times daily as needed (palpitations). - Oral   *If you need a refill on your cardiac medications before your next appointment, please call your pharmacy*  Lab Work:  None ordered.  If you have labs (blood work) drawn today and your tests are completely normal, you will receive your results only by: MyChart Message (if you have MyChart) OR A paper copy in the mail If you have any lab test that is abnormal or we need to change your treatment, we will call you to review the results.  Testing/Procedures:  none ordered.  Follow-Up: At [facility], you and your health needs are our priority.  As part of our continuing mission to provide you with exceptional heart care, our providers are all part of one team.  This team includes your primary Cardiologist (physician) and Advanced Practice Providers or APPs (Physician Assistants and Nurse Practitioners) who all work together to provide you with the care you need, when you need it.  Your next appointment:   2 year(s)  Provider:   Rosaline Bane, NP    We recommend signing up for the patient portal called MyChart.  Sign up information is provided on this After Visit Summary.  MyChart is used to connect with patients for Virtual Visits (Telemedicine).  Patients are able to view lab/test results, encounter notes, upcoming appointments, etc.  Non-urgent messages can be sent to your provider as well.   To learn more about what you can do with MyChart, go to [email or web address].   Other Instructions  Your physician wants you to follow-up in: 2 year.  You will receive a reminder letter in the mail two months in advance. If you don't receive a letter, please call our office to schedule the follow-up appointment.

## 2024-04-13 NOTE — Telephone Encounter (Signed)
 S/w Dr. Pinkey office at [facility] # [phone] will fax pt's monitor and echo results from 2024.

## 2024-04-15 NOTE — Telephone Encounter (Signed)
 Received results of monitor and echo.  In media tab.

## 2024-06-03 ENCOUNTER — Encounter: Payer: Self-pay | Admitting: Family Medicine
